# Patient Record
Sex: Male | Born: 1975 | Hispanic: Yes | Marital: Married | Smoking: Never smoker
Health system: Southern US, Community
[De-identification: ages and names within clinical notes are randomized; demographics above are authoritative.]

## PROBLEM LIST (undated history)

## (undated) DIAGNOSIS — M549 Dorsalgia, unspecified: Secondary | ICD-10-CM

## (undated) HISTORY — PX: APPENDECTOMY: SHX54

## (undated) HISTORY — DX: Dorsalgia, unspecified: M54.9

---

## 2020-03-22 ENCOUNTER — Encounter: Payer: Self-pay | Admitting: Emergency Medicine

## 2020-03-22 ENCOUNTER — Emergency Department
Admission: EM | Admit: 2020-03-22 | Discharge: 2020-03-22 | Disposition: A | Discharge status: Home / Self Care | Payer: 59 | Attending: Student in an Organized Health Care Education/Training Program | Admitting: Student in an Organized Health Care Education/Training Program

## 2020-03-22 ENCOUNTER — Emergency Department: Payer: 59

## 2020-03-22 ENCOUNTER — Other Ambulatory Visit: Payer: Self-pay

## 2020-03-22 DIAGNOSIS — S39012A Strain of muscle, fascia and tendon of lower back, initial encounter: Secondary | ICD-10-CM | POA: Diagnosis not present

## 2020-03-22 DIAGNOSIS — S3992XA Unspecified injury of lower back, initial encounter: Secondary | ICD-10-CM | POA: Diagnosis present

## 2020-03-22 DIAGNOSIS — Y93F2 Activity, caregiving, lifting: Secondary | ICD-10-CM | POA: Diagnosis not present

## 2020-03-22 DIAGNOSIS — X500XXA Overexertion from strenuous movement or load, initial encounter: Secondary | ICD-10-CM | POA: Insufficient documentation

## 2020-03-22 LAB — URINALYSIS, COMPLETE (UACMP) WITH MICROSCOPIC
Bacteria, UA: NONE SEEN
Bilirubin Urine: NEGATIVE
Glucose, UA: NEGATIVE mg/dL
Hgb urine dipstick: NEGATIVE
Ketones, ur: NEGATIVE mg/dL
Leukocytes,Ua: NEGATIVE
Nitrite: NEGATIVE
Protein, ur: NEGATIVE mg/dL
Specific Gravity, Urine: 1.024 (ref 1.005–1.030)
Squamous Epithelial / HPF: NONE SEEN (ref 0–5)
pH: 5 (ref 5.0–8.0)

## 2020-03-22 LAB — CBC
HCT: 41.8 % (ref 39.0–52.0)
Hemoglobin: 14.4 g/dL (ref 13.0–17.0)
MCH: 31.4 pg (ref 26.0–34.0)
MCHC: 34.4 g/dL (ref 30.0–36.0)
MCV: 91.1 fL (ref 80.0–100.0)
Platelets: 274 10*3/uL (ref 150–400)
RBC: 4.59 MIL/uL (ref 4.22–5.81)
RDW: 12.6 % (ref 11.5–15.5)
WBC: 6.9 10*3/uL (ref 4.0–10.5)
nRBC: 0 % (ref 0.0–0.2)

## 2020-03-22 LAB — COMPREHENSIVE METABOLIC PANEL
ALT: 18 U/L (ref 0–44)
AST: 20 U/L (ref 15–41)
Albumin: 4.1 g/dL (ref 3.5–5.0)
Alkaline Phosphatase: 63 U/L (ref 38–126)
Anion gap: 7 (ref 5–15)
BUN: 13 mg/dL (ref 6–20)
CO2: 25 mmol/L (ref 22–32)
Calcium: 8.9 mg/dL (ref 8.9–10.3)
Chloride: 105 mmol/L (ref 98–111)
Creatinine, Ser: 0.85 mg/dL (ref 0.61–1.24)
GFR, Estimated: >60 mL/min (ref >60)
Glucose, Bld: 100 mg/dL — ABNORMAL HIGH (ref 70–99)
Potassium: 3.7 mmol/L (ref 3.5–5.1)
Sodium: 137 mmol/L (ref 135–145)
Total Bilirubin: 0.6 mg/dL (ref 0.3–1.2)
Total Protein: 7.4 g/dL (ref 6.5–8.1)

## 2020-03-22 LAB — LIPASE, BLOOD: Lipase: 31 U/L (ref 11–51)

## 2020-03-22 MED ORDER — CYCLOBENZAPRINE HCL 10 MG PO TABS
10.0000 mg | ORAL_TABLET | Freq: Once | ORAL | Status: AC
  Administered 2020-03-22: 10 mg via ORAL
  Filled 2020-03-22: qty 1

## 2020-03-22 MED ORDER — ORPHENADRINE CITRATE ER 100 MG PO TB12: 100.0000 mg | ORAL_TABLET | Freq: Two times a day (BID) | ORAL | 1 refills | Status: DC

## 2020-03-22 MED ORDER — LIDOCAINE 5 % EX PTCH: 2.0000 | MEDICATED_PATCH | Freq: Two times a day (BID) | CUTANEOUS | 0 refills | Status: DC

## 2020-03-22 MED ORDER — MUPIROCIN CALCIUM 2 % EX CREA: TOPICAL_CREAM | CUTANEOUS | 0 refills | Status: DC

## 2020-03-22 MED ORDER — NAPROXEN 500 MG PO TABS: 500.0000 mg | ORAL_TABLET | Freq: Two times a day (BID) | ORAL | 2 refills | Status: DC

## 2020-03-22 MED ORDER — LIDOCAINE 5 % EX PTCH
2.0000 | MEDICATED_PATCH | CUTANEOUS | Status: DC
  Administered 2020-03-22: 2 via TRANSDERMAL
  Filled 2020-03-22: qty 2

## 2020-03-22 MED ORDER — NAPROXEN 500 MG PO TABS
500.0000 mg | ORAL_TABLET | Freq: Once | ORAL | Status: AC
  Administered 2020-03-22: 500 mg via ORAL
  Filled 2020-03-22: qty 1

## 2020-03-22 NOTE — Discharge Instructions (Signed)
Follow discharge care instructions take medications as directed.

## 2020-03-22 NOTE — ED Notes (Signed)
Pt reports generalized back pain that started in the lower back but now is also felt in the mid-lower back; no known trauma, keeps him awake at night. Pt also reports urinary frequency. Denies pain with urination

## 2020-03-22 NOTE — ED Triage Notes (Addendum)
Pt states generalized back pain for months. Pt states 9/10 pain. Pt states increased urinary frequency.   Pt NAD at this time, tolerated ambulation to triage well.

## 2020-03-22 NOTE — ED Provider Notes (Signed)
[facility] Emergency Department Provider Note   ____________________________________________   First MD Initiated Contact with Patient 03/22/20 1400     (approximate)  I have reviewed the triage vital signs and the nursing notes.   HISTORY  Chief Complaint Abdominal Pain    HPI Jermaine Hill is a 44 y.o. male patient presents with few months of back pain.  Patient denies bladder/ bowel dysfunction except for urinary frequency.  Patient denies polydipsia.  Patient denies specific provocative incident for complaint.  Patient his job requires intermitting heavy lifting.  Patient describes his pain is "achy/spasmatic".  Patient the pain radiates from the center of his back to the bilateral paraspinal muscle area.  Patient rates pain as a 9/10.  No palliative measure for complaint.          History reviewed. No pertinent past medical history.  There are no problems to display for this patient.   Past Surgical History:  Procedure Laterality Date  . APPENDECTOMY      Prior to Admission medications   Medication Sig Start Date End Date Taking? Authorizing Provider  lidocaine (LIDODERM) 5 % Place 2 patches onto the skin every 12 (twelve) hours. Remove & Discard patch within 12 hours or as directed by MD 03/22/20 03/22/21  Joni Reining, PA-C  mupirocin cream (BACTROBAN) 2 % Apply to affected affected toe 3 times daily. 03/22/20 03/22/21  Joni Reining, PA-C  naproxen (NAPROSYN) 500 MG tablet Take 1 tablet (500 mg total) by mouth 2 (two) times daily with a meal. 03/22/20 03/22/21  Joni Reining, PA-C  orphenadrine (NORFLEX) 100 MG tablet Take 1 tablet (100 mg total) by mouth 2 (two) times daily. 03/22/20 03/22/21  Joni Reining, PA-C    Allergies Patient has no allergy information on record.  No family history on file.  Social History Social History   Tobacco Use  . Smoking status: Never Smoker  . Smokeless tobacco: Never Used  Substance  Use Topics  . Alcohol use: Yes    Comment: socially  . Drug use: Never    Review of Systems Constitutional: No fever/chills Eyes: No visual changes. ENT: No sore throat. Cardiovascular: Denies chest pain. Respiratory: Denies shortness of breath. Gastrointestinal: No abdominal pain.  No nausea, no vomiting.  No diarrhea.  No constipation. Genitourinary: Negative for dysuria. Musculoskeletal: Positive for back pain. Skin: Negative for rash. Neurological: Negative for headaches, focal weakness or numbness.   ____________________________________________   PHYSICAL EXAM:  VITAL SIGNS: ED Triage Vitals  Enc Vitals Group     BP 03/22/20 1254 (!) 147/87     Pulse Rate 03/22/20 1254 83     Resp 03/22/20 1254 17     Temp 03/22/20 1254 98.4 F (36.9 C)     Temp Source 03/22/20 1254 Oral     SpO2 03/22/20 1254 96 %     Weight 03/22/20 1256 156 lb 8.4 oz (71 kg)     Height 03/22/20 1256 5' 10.08" (1.78 m)     Head Circumference --      Peak Flow --      Pain Score 03/22/20 1256 9     Pain Loc --      Pain Edu? --      Excl. in GC? --    Constitutional: Alert and oriented. Well appearing and in no acute distress. Cardiovascular: Normal rate, regular rhythm. Grossly normal heart sounds.  Good peripheral circulation. Respiratory: Normal respiratory effort.  No retractions. Lungs CTAB.  Gastrointestinal: Soft and nontender. No distention. No abdominal bruits. No CVA tenderness. Genitourinary: Deferred Musculoskeletal: No obvious lumbar spine deformity.  Patient has bilateral paraspinal muscle guarding and spasms.  No lower extremity tenderness nor edema.  No joint effusions. Neurologic:  Normal speech and language. No gross focal neurologic deficits are appreciated. No gait instability. Skin: Edema and erythema right great toe.  Psychiatric: Mood and affect are normal. Speech and behavior are normal.  ____________________________________________   LABS (all labs ordered are  listed, but only abnormal results are displayed)  Labs Reviewed  COMPREHENSIVE METABOLIC PANEL - Abnormal; Notable for the following components:      Result Value   Glucose, Bld 100 (*)    All other components within normal limits  URINALYSIS, COMPLETE (UACMP) WITH MICROSCOPIC - Abnormal; Notable for the following components:   Color, Urine YELLOW (*)    APPearance CLEAR (*)    All other components within normal limits  LIPASE, BLOOD  CBC   ____________________________________________  EKG   ____________________________________________  RADIOLOGY I, Joni Reining, personally viewed and evaluated these images (plain radiographs) as part of my medical decision making, as well as reviewing the written report by the radiologist.  ED MD interpretation:    Official radiology report(s): DG Lumbar Spine 2-3 Views  Result Date: 03/22/2020 CLINICAL DATA:  Low back pain EXAM: LUMBAR SPINE - 2-3 VIEW COMPARISON:  None. FINDINGS: Frontal, lateral, and spot lumbosacral lateral images were obtained. There are 5 non-rib-bearing lumbar type vertebral bodies. There is no fracture or spondylolisthesis. The disc spaces appear normal. No appreciable erosive change. IMPRESSION: No fracture or spondylolisthesis.  No evident arthropathy Electronically Signed   By: Bretta Bang III M.D.   On: 03/22/2020 14:55    ____________________________________________   PROCEDURES  Procedure(s) performed (including Critical Care):  Procedures   ____________________________________________   INITIAL IMPRESSION / ASSESSMENT AND PLAN / ED COURSE  As part of my medical decision making, I reviewed the following data within the electronic MEDICAL RECORD NUMBER     Patient presents with bilateral back pain for few months.  Patient also stated with increased urinary frequency and he was concerned for diabetes.  Patient also has edema and erythema to the right great toe.  Patient complaining physical exam  consistent with lumbar strain.  Patient also has cellulitis to the right great toe.  Patient given discharge care instruction and advised to take medication as directed.  [facility]          ____________________________________________   FINAL CLINICAL IMPRESSION(S) / ED DIAGNOSES  Final diagnoses:  Lumbar strain, initial encounter     ED Discharge Orders         Ordered    lidocaine (LIDODERM) 5 %  Every 12 hours        03/22/20 1511    naproxen (NAPROSYN) 500 MG tablet  2 times daily with meals        03/22/20 1511    orphenadrine (NORFLEX) 100 MG tablet  2 times daily        03/22/20 1511    mupirocin cream (BACTROBAN) 2 %        03/22/20 1513          *Please note:  Nassim Cosma was evaluated in Emergency Department on 03/22/2020 for the symptoms described in the history of present illness. He was evaluated in the context of the global COVID-19 pandemic, which necessitated consideration that the patient might be at  risk for infection with the SARS-CoV-2 virus that causes COVID-19. Institutional protocols and algorithms that pertain to the evaluation of patients at risk for COVID-19 are in a state of rapid change based on information released by regulatory bodies including the CDC and federal and state organizations. These policies and algorithms were followed during the patient's care in the ED.  Some ED evaluations and interventions may be delayed as a result of limited staffing during and the pandemic.*   Note:  This document was prepared using Dragon voice recognition software and may include unintentional dictation errors.    Joni Reining, PA-C 03/22/20 1516    Willy Eddy, MD 03/22/20 1534

## 2020-09-15 ENCOUNTER — Telehealth: Payer: Self-pay

## 2020-09-15 NOTE — Telephone Encounter (Signed)
This patient called to see if him, his wife, and daughter can establish care with Dr. [place].  Wife is [name] MRN [id number] Daughter is [name] MRN [id number]  He would like to have them all seen together at the same time after 3:30   Can I schedule these patients?

## 2020-09-16 NOTE — Telephone Encounter (Signed)
Okay to schedule

## 2020-09-29 ENCOUNTER — Ambulatory Visit (INDEPENDENT_AMBULATORY_CARE_PROVIDER_SITE_OTHER): Payer: 59 | Admitting: Family Medicine

## 2020-09-29 ENCOUNTER — Encounter: Payer: Self-pay | Admitting: Family Medicine

## 2020-09-29 ENCOUNTER — Other Ambulatory Visit: Payer: Self-pay

## 2020-09-29 VITALS — BP 120/80 | HR 85 | Temp 98.4°F | Resp 12 | Ht 70.8 in | Wt 159.6 lb

## 2020-09-29 DIAGNOSIS — R350 Frequency of micturition: Secondary | ICD-10-CM

## 2020-09-29 DIAGNOSIS — M545 Low back pain, unspecified: Secondary | ICD-10-CM

## 2020-09-29 DIAGNOSIS — G8929 Other chronic pain: Secondary | ICD-10-CM | POA: Diagnosis not present

## 2020-09-29 NOTE — Patient Instructions (Addendum)
A few things to remember from today's visit:   Chronic right-sided low back pain without sciatica - Plan: Basic metabolic panel, CBC, Urinalysis, Routine w reflex microscopic, Ambulatory referral to Physical Therapy  If you need refills please call your pharmacy. Do not use My Chart to request refills or for acute issues that need immediate attention.    Dolor de espalda crnico Chronic Back Pain Cuando el dolor en la espalda dura ms de 3 meses, se denomina dolor de espalda crnico. El dolor puede empeorar en ciertos momentos (exacerbaciones). Hay cosas que puede hacer en su casa para Runner, broadcasting/film/video. Siga estas instrucciones en su casa: Est atento a cualquier cambio en los sntomas. Tome estas medidas para Acupuncturist dolor: Control del dolor y de la rigidez  Si se lo indican, aplique hielo sobre la zona dolorida. El mdico puede indicarle que use hielo durante las 24 a 48 horas luego del comienzo de Investment banker, corporate. Para hacer esto: ? Ponga el hielo en una bolsa plstica. ? Coloque una [employer] piel y Copy. ? Coloque el hielo durante , 2 a 3veces por da.  Si se lo indican, aplique calor sobre la zona dolorida. Hgalo con la frecuencia que le haya indicado el mdico. Use la fuente de calor que el mdico le recomiende, como una compresa de calor hmedo o una almohadilla trmica. ? Coloque una [employer] piel y la fuente de Airline pilot. ? Aplique calor durante 20 a . ? Retire la fuente de calor si la piel se le pone de color rojo brillante. Esto es especialmente importante si no puede sentir dolor, calor o fro. Puede correr un riesgo mayor de sufrir quemaduras.  Sumrjase en un bao clido. Esto puede ayudar a Engineer, materials.      Actividad  Evite agacharse y Education officer, environmental otras actividades que Forensic scientist.  Cuando est de pie: ? Mantenga la parte alta de la espalda y el cuello rectos. ? Mantenga los hombros [employer]. ? Evite  encorvarse.  Cuando est sentado: ? Mantenga la espalda recta. ? Relaje los hombros. No curve los hombros ni los [employer].  No permanezca sentado o de pie en el mismo lugar durante mucho tiempo.  Durante el da, haga pausas breves para descansar. Por lo general, recostarse o Personal assistant de pie es mejor que permanecer sentado. Descansar puede ayudar a Engineer, materials.  Cuando est sentado o de pie por [employer], haga un poco de [place] o ejercicios de estiramiento. Esto ayudar a Transport planner rigidez y Chief Technology Officer.  Haga ejercicio con regularidad. Pregntele al mdico qu actividades son seguras para usted.  No levante ningn objeto que pese ms de 10libras (4.5kg) o el lmite de peso que le indiquen hasta que el mdico le diga que puede [place].  Para evitar lesionarse cuando levanta objetos: ? Flexione las rodillas. ? Mantenga el peso cerca del cuerpo. ? No se tuerza.  Duerma sobre un [employer]. Intente acostarse de costado, con las rodillas ligeramente flexionadas. Si se recuesta [employer], coloque una almohada debajo de las rodillas.   Medicamentos  El tratamiento puede incluir medicamentos para Chief Technology Officer y la hinchazn que se toman por boca o que se ponen sobre la piel, analgsicos recetados o relajantes musculares.  Use los medicamentos de venta libre y los recetados solamente como se lo haya indicado el mdico.  Consulte a su mdico si el medicamento que le recetaron: ? Hace necesario que evite  conducir o Child psychotherapist. ? Puede causarle dificultad para defecar (estreimiento). Es posible que deba tomar medidas para prevenir o tratar los problemas para defecar:  Product manager suficiente lquido para Radio producer pis (la orina) de color amarillo plido.  Usar medicamentos recetados o de Sales promotion account executive.  Comer alimentos ricos en fibra. Entre ellos, frijoles, cereales integrales y frutas y verduras frescas.  Limitar los alimentos con alto contenido de grasa y  International aid/development worker. Estos incluyen alimentos fritos o dulces. Instrucciones generales  No consuma ningn producto que contenga nicotina o tabaco, como cigarrillos, cigarrillos electrnicos y tabaco de Theatre manager. Si necesita ayuda para dejar de consumir estos productos, consulte al mdico.  Concurra a todas las visitas de seguimiento como se lo haya indicado el mdico. Esto es importante. Comunquese con un mdico si:  El dolor no se alivia con reposo ni con medicamentos.  Su dolor empeora o tiene un dolor nuevo.  Tiene fiebre alta.  Pierde peso muy rpidamente.  Tiene dificultad para [employer] cotidianas. Solicite ayuda de inmediato si:  Siente debilidad en una o ambas piernas o pies.  Pierde la sensibilidad (adormecimiento) en una o ambas piernas o pies.  Tiene dificultad para Scientist, physiological materia fecal (las deposiciones) o el pis (la [place]).  Tiene un dolor muy intenso en la espalda y: ? Tiene ganas de vomitar (nuseas) o vomita. ? Siente dolor en el vientre (abdomen). ? Le falta el aire. ? Se desmaya. Resumen  Cuando el dolor en la espalda dura ms de 3 meses, se denomina dolor de espalda crnico.  El dolor puede empeorar en ciertos momentos (exacerbaciones).  Use hielo y calor segn le indique el mdico. El mdico puede indicarle que use hielo luego del comienzo de las exacerbaciones. Esta informacin no tiene Theme park manager el consejo del mdico. Asegrese de hacerle al mdico cualquier pregunta que tenga. Document Revised: 09/10/2019 Document Reviewed: 09/10/2019 Elsevier Patient Education  2021 Elsevier Inc.  Please be sure medication list is accurate. If a new problem present, please set up appointment sooner than planned today.

## 2020-09-29 NOTE — Progress Notes (Signed)
HPI: Mr.Jermaine Hill is a 45 y.o. male, who is here today to establish care.  Former PCP: N/A Last preventive routine visit: A few years ago.  Chronic medical problems: Chronic back pain.  Concerns today: Back pain. Right-sided lower back pain, intermittent for years. Pain is exacerbated by movement and usually worse at the end of the day after work.  Alleviated by rest and position changes. Pain interferes with sleep sometimes. TENS has helped. Pain is not radiated. Negative for saddle anesthesia,LE numbness/tingling,or bladder/bowel dysfunction.  He is concerned about possible renal disease causing symptoms. Urinary frequency sine 03/2020. Sometimes urine has "strong" odor. Negative for dysuria, gross hematuria,or decreased urine output. He has not noted fever,abnormal wt loss,night sweats,abdominal pain,N/V,or urethral discharge.  He was evaluated in the ER on 03/22/20 for back pain. He was treated with Flexeril and Naproxen.  No hx of trauma. Topical OTC treatment helped some.  Review of Systems  Constitutional: Negative for activity change, appetite change and fatigue.  HENT: Negative for nosebleeds and sore throat.   Respiratory: Negative for cough, shortness of breath and wheezing.   Cardiovascular: Negative for chest pain, palpitations and leg swelling.  Gastrointestinal: Negative for blood in stool.       No changes in bowel habits.  Skin: Negative for pallor and rash.  Neurological: Negative for syncope, weakness and headaches.  Psychiatric/Behavioral: Negative for confusion. The patient is nervous/anxious.   Rest see pertinent positives and negatives per HPI.  No current outpatient medications on file prior to visit.   No current facility-administered medications on file prior to visit.   Past Medical History:  Diagnosis Date  . Back pain    No Known Allergies  Family History  Problem Relation Age of Onset  . Hypertension Mother   . Diabetes  Sister   . Alcohol abuse Brother   . Arthritis Paternal Grandmother   . Asthma Paternal Grandfather   . Early death Paternal Grandfather     Social History   Socioeconomic History  . Marital status: Married    Spouse name: Not on file  . Number of children: Not on file  . Years of education: Not on file  . Highest education level: Not on file  Occupational History  . Not on file  Tobacco Use  . Smoking status: Never Smoker  . Smokeless tobacco: Never Used  Substance and Sexual Activity  . Alcohol use: Yes    Comment: socially  . Drug use: Never  . Sexual activity: Yes  Other Topics Concern  . Not on file  Social History Narrative  . Not on file   Social Determinants of Health   Financial Resource Strain: Not on file  Food Insecurity: Not on file  Transportation Needs: Not on file  Physical Activity: Not on file  Stress: Not on file  Social Connections: Not on file    Vitals:   09/29/20 1547  BP: 120/80  Pulse: 85  Resp: 12  Temp: 98.4 F (36.9 C)  SpO2: 96%   Body mass index is 22.39 kg/m.  Physical Exam Vitals and nursing note reviewed.  Constitutional:      General: He is not in acute distress.    Appearance: He is well-developed and normal weight.  HENT:     Head: Normocephalic and atraumatic.     Mouth/Throat:     Mouth: Mucous membranes are moist.     Pharynx: Oropharynx is clear.  Eyes:     Conjunctiva/sclera: Conjunctivae normal.  Cardiovascular:     Rate and Rhythm: Normal rate and regular rhythm.     Pulses:          Dorsalis pedis pulses are 2+ on the right side and 2+ on the left side.     Heart sounds: No murmur heard.   Pulmonary:     Effort: Pulmonary effort is normal. No respiratory distress.     Breath sounds: Normal breath sounds.  Abdominal:     Palpations: Abdomen is soft. There is no hepatomegaly or mass.     Tenderness: There is no abdominal tenderness. There is no right CVA tenderness or left CVA tenderness.   Musculoskeletal:     Lumbar back: Tenderness present. No bony tenderness. Negative right straight leg raise test and negative left straight leg raise test.  Lymphadenopathy:     Cervical: No cervical adenopathy.  Skin:    General: Skin is warm.     Findings: No erythema or rash.  Neurological:     Mental Status: He is alert and oriented to person, place, and time.     Cranial Nerves: No cranial nerve deficit.     Gait: Gait normal.     Deep Tendon Reflexes:     Reflex Scores:      Patellar reflexes are 2+ on the right side and 2+ on the left side. Psychiatric:        Mood and Affect: Mood is anxious.     Comments: Well groomed, good eye contact.    ASSESSMENT AND PLAN:  Jermaine Hill was seen today for establish care.  Diagnoses and all orders for this visit:  Orders Placed This Encounter  Procedures  . Basic metabolic panel  . CBC  . Urinalysis, Routine w reflex microscopic  . Ambulatory referral to Physical Therapy   Lab Results  Component Value Date   CREATININE 0.96 09/29/2020   BUN 18 09/29/2020   NA 138 09/29/2020   K 3.9 09/29/2020   CL 105 09/29/2020   CO2 27 09/29/2020   Lab Results  Component Value Date   WBC 6.6 09/29/2020   HGB 14.0 09/29/2020   HCT 41.1 09/29/2020   MCV 92.8 09/29/2020   PLT 291.0 09/29/2020   Chronic right-sided low back pain without sciatica Explained that this is a chronic problem, so the probability of problem resolution is low. Goal of treatment discussed. We discussed side effects of chronic use of NSAID's, given his concerns about renal disease, recommend Tylenol 500 mg 3-4 times per day. Continue OTC topical treatment. PT will be arranged. If not improved, we can consider ortho evaluation.  Urinary frequency No associated dysuria or gross hematuria. Hx does not suggest a serious process. Adequate hydration recommended. Further recommendations according to UA and BMP. Instructed about warning signs.   Return in 1 year  (on 09/29/2021), or if symptoms worsen or fail to improve, for Before if needed..   Remer Couse G. SwazilandJordan, MD  [facility]. [facility].  A few things to remember from today's visit:   Chronic right-sided low back pain without sciatica - Plan: Basic metabolic panel, CBC, Urinalysis, Routine w reflex microscopic, Ambulatory referral to Physical Therapy  If you need refills please call your pharmacy. Do not use My Chart to request refills or for acute issues that need immediate attention.    Dolor de espalda crnico Chronic Back Pain Cuando el dolor en la espalda dura ms de 3 meses, se denomina dolor de espalda crnico. El dolor puede empeorar en  ciertos momentos (exacerbaciones). Hay cosas que puede hacer en su casa para Runner, broadcasting/film/video. Siga estas instrucciones en su casa: Est atento a cualquier cambio en los sntomas. Tome estas medidas para Acupuncturist dolor: Control del dolor y de la rigidez  Si se lo indican, aplique hielo sobre la zona dolorida. El mdico puede indicarle que use hielo durante las 24 a 48 horas luego del comienzo de Investment banker, corporate. Para hacer esto: ? Ponga el hielo en una bolsa plstica. ? Coloque una [employer] piel y Copy. ? Coloque el hielo durante , 2 a 3veces por da.  Si se lo indican, aplique calor sobre la zona dolorida. Hgalo con la frecuencia que le haya indicado el mdico. Use la fuente de calor que el mdico le recomiende, como una compresa de calor hmedo o una almohadilla trmica. ? Coloque una [employer] piel y la fuente de Airline pilot. ? Aplique calor durante 20 a . ? Retire la fuente de calor si la piel se le pone de color rojo brillante. Esto es especialmente importante si no puede sentir dolor, calor o fro. Puede correr un riesgo mayor de sufrir quemaduras.  Sumrjase en un bao clido. Esto puede ayudar a Engineer, materials.      Actividad  Evite agacharse y Education officer, environmental otras actividades que Journalist, newspaper.  Cuando est de pie: ? Mantenga la parte alta de la espalda y el cuello rectos. ? Mantenga los hombros [employer]. ? Evite encorvarse.  Cuando est sentado: ? Mantenga la espalda recta. ? Relaje los hombros. No curve los hombros ni los D[employer] Restaurants.  No permanezca sentado o de pie en el mismo lugar durante mucho tiempo.  Durante el da, haga pausas breves para descansar. Por lo general, recostarse o Personal assistant de pie es mejor que permanecer sentado. Descansar puede ayudar a Engineer, materials.  Cuando est sentado o de pie por [employer], haga un poco de [place] o ejercicios de estiramiento. Esto ayudar a Transport planner rigidez y Chief Technology Officer.  Haga ejercicio con regularidad. Pregntele al mdico qu actividades son seguras para usted.  No levante ningn objeto que pese ms de 10libras (4.5kg) o el lmite de peso que le indiquen hasta que el mdico le diga que puede [place].  Para evitar lesionarse cuando levanta objetos: ? Flexione las rodillas. ? Mantenga el peso cerca del cuerpo. ? No se tuerza.  Duerma sobre un [employer]. Intente acostarse de costado, con las rodillas ligeramente flexionadas. Si se recuesta [employer], coloque una almohada debajo de las rodillas.   Medicamentos  El tratamiento puede incluir medicamentos para Chief Technology Officer y la hinchazn que se toman por boca o que se ponen sobre la piel, analgsicos recetados o relajantes musculares.  Use los medicamentos de venta libre y los recetados solamente como se lo haya indicado el mdico.  Consulte a su mdico si el medicamento que le recetaron: ? Hace necesario que evite conducir o usar [place]. ? Puede causarle dificultad para defecar (estreimiento). Es posible que deba tomar medidas para prevenir o tratar los problemas para defecar:  Product manager suficiente lquido para Radio producer pis (la orina) de color amarillo plido.  Usar medicamentos recetados o de Sales promotion account executive.  Comer alimentos  ricos en fibra. Entre ellos, frijoles, cereales integrales y frutas y verduras frescas.  Limitar los alimentos con alto contenido de grasa y International aid/development worker. Estos incluyen alimentos fritos o dulces. Instrucciones generales  No consuma ningn producto que contenga  nicotina o tabaco, como cigarrillos, cigarrillos electrnicos y tabaco de Theatre manager. Si necesita ayuda para dejar de consumir estos productos, consulte al mdico.  Concurra a todas las visitas de seguimiento como se lo haya indicado el mdico. Esto es importante. Comunquese con un mdico si:  El dolor no se alivia con reposo ni con medicamentos.  Su dolor empeora o tiene un dolor nuevo.  Tiene fiebre alta.  Pierde peso muy rpidamente.  Tiene dificultad para [employer] cotidianas. Solicite ayuda de inmediato si:  Siente debilidad en una o ambas piernas o pies.  Pierde la sensibilidad (adormecimiento) en una o ambas piernas o pies.  Tiene dificultad para Scientist, physiological materia fecal (las deposiciones) o el pis (la [place]).  Tiene un dolor muy intenso en la espalda y: ? Tiene ganas de vomitar (nuseas) o vomita. ? Siente dolor en el vientre (abdomen). ? Le falta el aire. ? Se desmaya. Resumen  Cuando el dolor en la espalda dura ms de 3 meses, se denomina dolor de espalda crnico.  El dolor puede empeorar en ciertos momentos (exacerbaciones).  Use hielo y calor segn le indique el mdico. El mdico puede indicarle que use hielo luego del comienzo de las exacerbaciones. Esta informacin no tiene Theme park manager el consejo del mdico. Asegrese de hacerle al mdico cualquier pregunta que tenga. Document Revised: 09/10/2019 Document Reviewed: 09/10/2019 Elsevier Patient Education  2021 Elsevier Inc.  Please be sure medication list is accurate. If a new problem present, please set up appointment sooner than planned today.

## 2020-09-30 LAB — URINALYSIS, ROUTINE W REFLEX MICROSCOPIC
Bilirubin Urine: NEGATIVE
Hgb urine dipstick: NEGATIVE
Ketones, ur: NEGATIVE
Leukocytes,Ua: NEGATIVE
Nitrite: NEGATIVE
RBC / HPF: NONE SEEN (ref 0–?)
Specific Gravity, Urine: 1.02 (ref 1.000–1.030)
Total Protein, Urine: NEGATIVE
Urine Glucose: NEGATIVE
Urobilinogen, UA: 0.2 (ref 0.0–1.0)
WBC, UA: NONE SEEN (ref 0–?)
pH: 6.5 (ref 5.0–8.0)

## 2020-09-30 LAB — BASIC METABOLIC PANEL
BUN: 18 mg/dL (ref 6–23)
CO2: 27 mEq/L (ref 19–32)
Calcium: 9.2 mg/dL (ref 8.4–10.5)
Chloride: 105 mEq/L (ref 96–112)
Creatinine, Ser: 0.96 mg/dL (ref 0.40–1.50)
GFR: 96.06 mL/min (ref >60.00)
Glucose, Bld: 105 mg/dL — ABNORMAL HIGH (ref 70–99)
Potassium: 3.9 mEq/L (ref 3.5–5.1)
Sodium: 138 mEq/L (ref 135–145)

## 2020-09-30 LAB — CBC
HCT: 41.1 % (ref 39.0–52.0)
Hemoglobin: 14 g/dL (ref 13.0–17.0)
MCHC: 34 g/dL (ref 30.0–36.0)
MCV: 92.8 fl (ref 78.0–100.0)
Platelets: 291 10*3/uL (ref 150.0–400.0)
RBC: 4.42 Mil/uL (ref 4.22–5.81)
RDW: 13.3 % (ref 11.5–15.5)
WBC: 6.6 10*3/uL (ref 4.0–10.5)

## 2020-10-02 ENCOUNTER — Encounter: Payer: Self-pay | Admitting: Family Medicine

## 2020-10-10 ENCOUNTER — Ambulatory Visit: Payer: 59 | Attending: Family Medicine | Admitting: Physical Therapy

## 2020-10-10 ENCOUNTER — Other Ambulatory Visit: Payer: Self-pay

## 2020-10-10 ENCOUNTER — Encounter: Payer: Self-pay | Admitting: Physical Therapy

## 2020-10-10 DIAGNOSIS — R252 Cramp and spasm: Secondary | ICD-10-CM | POA: Diagnosis present

## 2020-10-10 DIAGNOSIS — G8929 Other chronic pain: Secondary | ICD-10-CM | POA: Insufficient documentation

## 2020-10-10 DIAGNOSIS — M545 Low back pain, unspecified: Secondary | ICD-10-CM | POA: Diagnosis not present

## 2020-10-10 NOTE — Patient Instructions (Signed)
Access Code: [email or web address] URL: [email or web address] Date: 10/10/2020 Prepared by: Loistine Simas Jaheem Hedgepath  Exercises Supine Hamstring Stretch - 1 x daily - 7 x weekly - 1 sets - 3 reps - 30 hold Supine Figure 4 Piriformis Stretch - 1 x daily - 7 x weekly - 1 sets - 3 reps - 30 hold Supine Piriformis Stretch Pulling Heel to Hip - 1 x daily - 7 x weekly - 1 sets - 3 reps - 30 hold Sidelying Thoracic Rotation with Open Book - 1 x daily - 7 x weekly - 1 sets - 10 reps Standing Hip Flexor Stretch - 1 x daily - 7 x weekly - 1 sets - 3 reps - 30 hold Supine Pelvic Floor Stretch - Hands on Knees - 1 x daily - 7 x weekly - 1 sets - 2 reps - 60 hold Child's Pose Stretch - 1 x daily - 7 x weekly - 1 sets - 3 reps - 10 hold Child's Pose with Sidebending - 1 x daily - 7 x weekly - 1 sets - 3 reps - 10 hold   Trigger Point Dry Needling  . What is Trigger Point Dry Needling (DN)? o DN is a physical therapy technique used to treat muscle pain and dysfunction. Specifically, DN helps deactivate muscle trigger points (muscle knots).  o A thin filiform needle is used to penetrate the skin and stimulate the underlying trigger point. The goal is for a local twitch response (LTR) to occur and for the trigger point to relax. No medication of any kind is injected during the procedure.   . What Does Trigger Point Dry Needling Feel Like?  o The procedure feels different for each individual patient. Some patients report that they do not actually feel the needle enter the skin and overall the process is not painful. Very mild bleeding may occur. However, many patients feel a deep cramping in the muscle in which the needle was inserted. This is the local twitch response.   Marland Kitchen How Will I feel after the treatment? o Soreness is normal, and the onset of soreness may not occur for a few hours. Typically this soreness does not last longer than two days.  o Bruising is uncommon, however; ice can be used to decrease any  possible bruising.  o In rare cases feeling tired or nauseous after the treatment is normal. In addition, your symptoms may get worse before they get better, this period will typically not last longer than 24 hours.   . What Can I do After My Treatment? o Increase your hydration by drinking more water for the next 24 hours. o You may place ice or heat on the areas treated that have become sore, however, do not use heat on inflamed or bruised areas. Heat often brings more relief post needling. o You can continue your regular activities, but vigorous activity is not recommended initially after the treatment for 24 hours. o DN is best combined with other physical therapy such as strengthening, stretching, and other therapies.    [facility] [address] [place], [place] [address] Phone # [phone] Fax [phone]

## 2020-10-10 NOTE — Therapy (Signed)
[facility] [address] [place], [place], [address] Phone: [phone]   Fax:  [phone]  Physical Therapy Evaluation  Patient Details  Name: Jermaine Hill MRN: [id number] Date of Birth: 1975-07-14 Referring Provider (PT): Betty Swaziland, MD   Encounter Date: 10/10/2020   PT End of Session - 10/10/20 0853    Visit Number 1    Date for PT Re-Evaluation 12/05/20    Authorization Type [facility]    PT Start Time 0855   Pt late   PT Stop Time 0945    PT Time Calculation (min) 50 min    Activity Tolerance Patient tolerated treatment well    Behavior During Therapy [facility] for tasks assessed/performed           Past Medical History:  Diagnosis Date  . Back pain     Past Surgical History:  Procedure Laterality Date  . APPENDECTOMY      There were no vitals filed for this visit.    Subjective Assessment - 10/10/20 0852    Subjective Pt referred to OPPT with history of low back pain.  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  Currently working as a Engineer, civil (consulting).  Pain is right sided and pain is worse with work tasks such as bending and pushing pedal of vehicle.  Pain disrupts sleep after 3-4 hours.    Limitations Other (comment)   work tasks   Diagnostic tests lumbar xrays 2021 negative    Currently in Pain? Yes    Pain Score 7     Pain Location Back    Pain Orientation Right    Pain Descriptors / Indicators Sharp    Pain Type Chronic pain;Acute pain    Pain Relieving Factors uses topical cream with analgesic              [facility] PT Assessment - 10/10/20 0001      Assessment   Medical Diagnosis M54.50,G89.29 (ICD-10-CM) - Chronic right-sided low back pain without sciatica    Referring Provider (PT) Betty Swaziland, MD    Onset Date/Surgical Date --   1 year ago   Hand Dominance Right     Prior Therapy no      Home Environment   Living Environment Private residence    Living Arrangements Spouse/significant other;Children      Prior Function   Level of Independence Independent    Vocation Full time employment   3rd shift     Functional Tests   Functional tests Squat      Squat   Comments ind with mild LBP      Posture/Postural Control   Posture/Postural Control No significant limitations      ROM / Strength   AROM / PROM / Strength AROM;PROM;Strength      AROM   Overall AROM  Within functional limits for tasks performed    AROM Assessment Site Lumbar;Thoracic    Lumbar Flexion full with pain on flexion and return from flexion    Lumbar Extension full, relief    Thoracic - Right Rotation limited 30%    Thoracic - Left Rotation limited 30%      PROM   Overall PROM Comments bil hips limited flexion and ER, soft tissue stretch end feel      Strength   Overall Strength Comments 5/5 bil LEs  Flexibility   Soft Tissue Assessment /Muscle Length yes   hip flexors and gluts limited 25% bil   Hamstrings limited 25% bil    Quadriceps limited 25% bil    Piriformis limited 30% bil    Quadratus Lumborum limited 20% bil      Palpation   Spinal mobility limited lumbar PAs L4-S1, relief with testing    SI assessment  Rt SI joint mildly compressed    Palpation comment signif soft tissue tension and spasm with pain on palpation: bil QL, lumbar paraspinals, thoracic paraspinals      Special Tests    Special Tests Lumbar    Lumbar Tests Straight Leg Raise      Straight Leg Raise   Findings Negative      Transfers   Comments ind with all transfers      Ambulation/Gait   Gait Comments WNL                      Objective measurements completed on examination: See above findings.       [facility] Adult PT Treatment/Exercise - 10/10/20 0001      Self-Care   Self-Care Other Self-Care Comments    Other Self-Care Comments  DN info and initial HEP,  discussion of possibility of pelvic floor tension on urinary frequency/urgency when pain is present                  PT Education - 10/10/20 0946    Education Details Access Code: [email or web address] + DN info    Person(s) Educated Patient    Methods Explanation;Handout;Demonstration;Verbal cues    Comprehension Verbalized understanding;Returned demonstration            PT Short Term Goals - 10/10/20 1005      PT SHORT TERM GOAL #1   Title Pt will be ind with initial HEP    Status New    Target Date 10/31/20      PT SHORT TERM GOAL #2   Title Pt will report improved sleep with at least 5 hours consecutive sleep before sleep disruption    Time 4    Period Weeks    Status New    Target Date 11/07/20             PT Long Term Goals - 10/10/20 1006      PT LONG TERM GOAL #1   Title Pt will achieve LE and trunk flexibility of soft tissues to WNL to reduce pain and undue compression on spine and pelvis.    Time 8    Period Weeks    Status New    Target Date 12/05/20      PT LONG TERM GOAL #2   Title Pt will be able to tolerate a work shift with min pain and know how to use tools from HEP to manage pain with work.    Time 8    Period Weeks    Status New    Target Date 12/05/20      PT LONG TERM GOAL #3   Title Pt will report at least 70% improvement in sleep quality due to improved pain.    Time 8    Period Weeks    Status New    Target Date 12/05/20      PT LONG TERM GOAL #4   Title Pt will demo improved thoracic rotation bil to WNL for improved visibility with driving.    Baseline limited 30% bil  Time 8    Period Weeks    Status New    Target Date 12/05/20      PT LONG TERM GOAL #5   Title Ind with advanced HEP and understand how to progress safely.    Time 8    Period Weeks    Status New    Target Date 12/05/20                  Plan - 10/10/20 0956    Clinical Impression Statement Pt is a pleasant 45yo male who moved to [place] from  [place] approx 1 year ago.  He has been working a variety of heavy labor jobs which have caused him to experience Rt>Lt low back pain.  He occassionally gets pain in thoracic spine at night after sleeping 3-4 hours.  He also notes increased urinary freq/urgency when his pain increases and wonders if it is related.  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  He works third shift.  Pt presents with full lumbar ROM with pain on flexion and return from flexion.  He finds relief in extension.  Bil thoracic rotation is limited 30%.  He has tightness and spasm present Rt>Lt in QL, lumbar paraspinals and thoracic paraspinals with pain on palpation.  LE flexiblity and hip ROM is mod-sig limited.  PT initiated HEP for spinal and LE stretching after which Pt reported improved pain.  PT discussed limits of scope of orthopedic PT with regards to his urinary freq/urgency but PT did explain how he may have pelvic floor tension and gave happy baby and diaphragmatic breathing cues with focus of pelvic muscle relaxation to see if this helps him.  Pt will benefit from skilled PT for manual techniques, stretching, ROM and HEP development to reduce pain and maximize function.    Personal Factors and Comorbidities Time since onset of injury/illness/exacerbation;Profession    Examination-Activity Limitations Lift;Sit;Bend;Sleep    Examination-Participation Restrictions Driving;Occupation;Cleaning;Yard Work    Stability/Clinical Decision Making Stable/Uncomplicated    Optometrist Low    Rehab Potential Excellent    PT Frequency 1x / week    PT Duration 8 weeks    PT Treatment/Interventions ADLs/Self Care Home Management;Electrical Stimulation;Cryotherapy;Moist Heat;Therapeutic activities;Therapeutic exercise;Neuromuscular re-education;Manual techniques;Patient/family education;Passive range of motion;Dry needling;Spinal Manipulations;Joint  Manipulations    PT Next Visit Plan f/u on response to initial HEP, manual or DN to bil QL, lumbar multifidi, manual techniques for lumbar gapping and STM along spine and hips for release    PT Home Exercise Plan Access Code: [email or web address]    Consulted and Agree with Plan of Care Patient           Patient will benefit from skilled therapeutic intervention in order to improve the following deficits and impairments:  Decreased range of motion,Pain,Increased muscle spasms,Decreased mobility,Hypomobility,Impaired flexibility,Decreased activity tolerance  Visit Diagnosis: Chronic bilateral low back pain without sciatica - Plan: PT plan of care cert/re-cert  Cramp and spasm - Plan: PT plan of care cert/re-cert     Problem List There are no problems to display for this patient.  Loistine Simas Patsy Varma, PT 10/10/20 10:12 AM   [facility] [address] [place], [place], [address] Phone: [phone]   Fax:  [phone]  Name: Vergil Burby MRN: [id number] Date of Birth: 11-23-1975

## 2020-10-15 ENCOUNTER — Ambulatory Visit: Payer: 59 | Admitting: Physical Therapy

## 2020-10-29 ENCOUNTER — Encounter: Payer: 59 | Admitting: Physical Therapy

## 2020-11-05 ENCOUNTER — Encounter: Payer: 59 | Admitting: Physical Therapy

## 2020-11-12 ENCOUNTER — Ambulatory Visit: Payer: 59 | Admitting: Family Medicine

## 2020-11-18 ENCOUNTER — Other Ambulatory Visit: Payer: Self-pay

## 2020-11-19 ENCOUNTER — Ambulatory Visit (INDEPENDENT_AMBULATORY_CARE_PROVIDER_SITE_OTHER): Payer: 59 | Admitting: Family Medicine

## 2020-11-19 VITALS — BP 120/80 | HR 73 | Temp 98.0°F | Resp 18 | Ht 70.0 in | Wt 155.4 lb

## 2020-11-19 DIAGNOSIS — R350 Frequency of micturition: Secondary | ICD-10-CM | POA: Diagnosis not present

## 2020-11-19 DIAGNOSIS — M545 Low back pain, unspecified: Secondary | ICD-10-CM

## 2020-11-19 DIAGNOSIS — G8929 Other chronic pain: Secondary | ICD-10-CM | POA: Diagnosis not present

## 2020-11-19 LAB — HEMOGLOBIN A1C: Hgb A1c MFr Bld: 5.8 % (ref 4.6–6.5)

## 2020-11-19 LAB — PSA: PSA: 0.95 ng/mL (ref 0.10–4.00)

## 2020-11-19 NOTE — Patient Instructions (Signed)
A few things to remember from today's visit:   Chronic right-sided low back pain without sciatica  Urine frequency - Plan: Hemoglobin A1c, PSA   Do not use My Chart to request refills or for acute issues that need immediate attention.  Please be sure medication list is accurate. If a new problem present, please set up appointment sooner than planned today.

## 2020-11-19 NOTE — Progress Notes (Signed)
Chief Complaint  Patient presents with   Back Pain    Seen on 09/29/20. He has tried PT, massage, creams like iceyhot- these things help temporarily. Increased urination and pressure in the back when urinating- concerned about the prostate. He drives heavy equipment at work.   HPI: Jermaine Hill is a 45 y.o. male with hx of chronic back pain here today complaining of persistent back pain as described above. He was seen on 09/29/20,. He is concerned about prostate or kidney disease being the cause of back pain.  Bilateral back pain, R>L,not radiated. Exacerbated by prolonged sitting. Alleviated by changing position. Pain has been stable for years. No recent trauma. Denies saddle anesthesia,LE numbness/tingling,and bowel/bladder dysfunction. He tries not to take oral medication for pain, afraid of side effects.  He completed PT,TENS, and massage help.  Negative for fever,chills,abnormal wt loss, night sweats,abdominal pain,N/V,or skin rash.  03/22/20 lumbar X ray:No fracture or spondylolisthesis.  No evident arthropathy  -Frequent urination. Nocturia x 2-3. Problem has been stable for a while. It is not affecting productivity at work. Denies dysuria, gross hematuria,or decreased urine output. No urine incontinence.  Component     Latest Ref Rng & Units 03/22/2020  Color, Urine     Yellow;Lt. Yellow;Straw;Dark Yellow;Amber;Green;Red;Brown YELLOW (A)  Appearance     Clear;Turbid;Slightly Cloudy;Cloudy CLEAR (A)  Specific Gravity, Urine     1.000 - 1.030 1.024  pH     5.0 - 8.0 5.0  Glucose, UA     NEGATIVE mg/dL NEGATIVE  Hgb urine dipstick     Negative NEGATIVE  Bilirubin Urine     Negative NEGATIVE  Ketones, ur     Negative NEGATIVE  Protein     NEGATIVE mg/dL NEGATIVE  Nitrite     Negative NEGATIVE  Leukocytes,Ua     Negative NEGATIVE  RBC / HPF     0-2/hpf 0-5  WBC, UA     0-2/hpf 0-5  Bacteria, UA     NONE SEEN NONE SEEN  Squamous Epithelial / LPF      0 - 5 NONE SEEN  Mucus      PRESENT   Lab Results  Component Value Date   CREATININE 0.96 09/29/2020   BUN 18 09/29/2020   NA 138 09/29/2020   K 3.9 09/29/2020   CL 105 09/29/2020   CO2 27 09/29/2020   Review of Systems  Constitutional:  Negative for appetite change, chills and fatigue.  Gastrointestinal:        Negative for changes in bowel habits.  Endocrine: Negative for polydipsia, polyphagia and polyuria.  Genitourinary:  Negative for difficulty urinating, penile discharge, penile pain, scrotal swelling and urgency.  Musculoskeletal:  Negative for arthralgias and gait problem.  Neurological:  Negative for syncope and weakness.  Psychiatric/Behavioral:  Negative for behavioral problems and confusion.   Rest see pertinent positives and negatives per HPI.  No current outpatient medications on file prior to visit.   No current facility-administered medications on file prior to visit.   Past Medical History:  Diagnosis Date   Back pain    No Known Allergies  Social History   Socioeconomic History   Marital status: Married    Spouse name: Not on file   Number of children: Not on file   Years of education: Not on file   Highest education level: Not on file  Occupational History   Not on file  Tobacco Use   Smoking status: Never   Smokeless tobacco: Never  Substance and Sexual Activity   Alcohol use: Yes    Comment: socially   Drug use: Never   Sexual activity: Yes  Other Topics Concern   Not on file  Social History Narrative   ** Merged History Encounter **       Social Determinants of Health   Financial Resource Strain: Not on file  Food Insecurity: Not on file  Transportation Needs: Not on file  Physical Activity: Not on file  Stress: Not on file  Social Connections: Not on file   Vitals:   11/19/20 1142  BP: 120/80  Pulse: 73  Resp: 18  Temp: 98 F (36.7 C)  SpO2: 97%   Body mass index is 22.3 kg/m.  Physical Exam Nursing note  reviewed.  Constitutional:      General: He is not in acute distress.    Appearance: He is well-developed.  HENT:     Head: Normocephalic and atraumatic.  Eyes:     Conjunctiva/sclera: Conjunctivae normal.  Cardiovascular:     Rate and Rhythm: Normal rate and regular rhythm.     Pulses:          Dorsalis pedis pulses are 2+ on the right side and 2+ on the left side.     Heart sounds: No murmur heard. Pulmonary:     Effort: Pulmonary effort is normal. No respiratory distress.     Breath sounds: Normal breath sounds.  Abdominal:     Palpations: Abdomen is soft. There is no hepatomegaly or mass.     Tenderness: There is no abdominal tenderness.  Musculoskeletal:     Lumbar back: No spasms or tenderness. Negative right straight leg raise test and negative left straight leg raise test.  Lymphadenopathy:     Cervical: No cervical adenopathy.  Skin:    General: Skin is warm.     Findings: No erythema or rash.  Neurological:     Mental Status: He is alert and oriented to person, place, and time.     Cranial Nerves: No cranial nerve deficit.     Gait: Gait normal.     Deep Tendon Reflexes:     Reflex Scores:      Patellar reflexes are 2+ on the right side and 2+ on the left side. Psychiatric:     Comments: Well groomed, good eye contact.    ASSESSMENT AND PLAN:  Jermaine Hill was seen today for back pain.  Diagnoses and all orders for this visit: Orders Placed This Encounter  Procedures   Hemoglobin A1c   PSA   Lab Results  Component Value Date   PSA 0.95 11/19/2020    Lab Results  Component Value Date   HGBA1C 5.8 11/19/2020   Chronic right-sided low back pain without sciatica We discussed prognosis, this is a chronic problem, most likely it is not going to resolved for the contrary it may get better through the years. Hx and examination do not suggest a serious process. He prefers not to take NSAID's or muscle relaxants. Continue with PT exercising at home,  ergonomics at work,and local massage. Icy hot patch may also help.  Instructed about warning signs. F/U as needed.   Urine frequency We discussed possible etiologies. Prefers blood work instead rectal examination, ? BPH. Hx does not suggest a serious process. We discussed options: Pharmacologic treatment with vesicare vs urology evaluation, he prefers to hold on both for now. Monitor for new symptoms.   Return if symptoms worsen or fail to improve.  Jermaine Vos G. [place], MD  [facility]. [place] office.   A few things to remember from today's visit:   Chronic right-sided low back pain without sciatica  Urine frequency - Plan: Hemoglobin A1c, PSA   Do not use My Chart to request refills or for acute issues that need immediate attention.  Please be sure medication list is accurate. If a new problem present, please set up appointment sooner than planned today.

## 2020-11-22 ENCOUNTER — Encounter: Payer: Self-pay | Admitting: Family Medicine

## 2020-11-26 ENCOUNTER — Encounter: Payer: 59 | Admitting: Physical Therapy

## 2020-12-03 ENCOUNTER — Encounter: Payer: 59 | Admitting: Physical Therapy

## 2020-12-17 ENCOUNTER — Ambulatory Visit (INDEPENDENT_AMBULATORY_CARE_PROVIDER_SITE_OTHER): Payer: 59 | Admitting: Family Medicine

## 2020-12-17 ENCOUNTER — Other Ambulatory Visit: Payer: Self-pay

## 2020-12-17 ENCOUNTER — Encounter: Payer: Self-pay | Admitting: Family Medicine

## 2020-12-17 DIAGNOSIS — G8929 Other chronic pain: Secondary | ICD-10-CM

## 2020-12-17 DIAGNOSIS — M545 Low back pain, unspecified: Secondary | ICD-10-CM | POA: Diagnosis not present

## 2020-12-17 MED ORDER — BACLOFEN 10 MG PO TABS: 5.0000 mg | ORAL_TABLET | Freq: Every evening | ORAL | 3 refills | Status: AC | PRN

## 2020-12-17 MED ORDER — BACLOFEN 10 MG PO TABS: 5.0000 mg | ORAL_TABLET | Freq: Every evening | ORAL | 3 refills | Status: DC | PRN

## 2020-12-17 NOTE — Patient Instructions (Signed)
    Try these:  Vitamin D3:  Take 5,000 IU daily  Glucosamine:  1,000 mg twice daily  Turmeric:  500 mg twice daily

## 2020-12-17 NOTE — Progress Notes (Signed)
   Office Visit Note   Patient: Jermaine Hill           Date of Birth: [birth year]           MRN: [id number] Visit Date: 12/17/2020 Requested by: [place], Betty G, MD [address] [place],  [place] [address] PCP: [place], Betty G, MD  Subjective: Chief Complaint  Patient presents with   Lower Back - Pain    Pain mainly across the lower back x 6 months. It occasionally will radiate up the spine to between the shoulder blades. 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 Has tried otc creams, massage, TENS unit.    Middle Back - Pain    HPI: He is here with low back pain.  Symptoms started about 6 to 12 months ago, no definite injury.  Gradual onset of pain in the lower back with occasional pain between the shoulder blades.  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  After about an hour and a half of driving, his back becomes very uncomfortable.  He has tried stretching at home, over-the-counter creams, a massage device and a TENS unit.  These things give him temporary relief.  He had x-rays done which I reviewed, they were unremarkable.  He has had labs drawn as well and these have been normal.  Denies radicular symptoms, fevers or chills, is otherwise been in good health.                ROS:   All other systems were reviewed and are negative.  Objective: Vital Signs: There were no vitals taken for this visit.  Physical Exam:  General:  Alert and oriented, in no acute distress. Pulm:  Breathing unlabored. Psy:  Normal mood, congruent affect.  Low back: He has no scoliosis.  He is able to bend forward without severe pain.  Hyperextension does not cause severe pain.  He is tender to palpation near the midline in the mid lumbar spine paraspinous muscles.  No pain over the SI joints.  No pain in the sciatic notch.  No pain with internal/external hip rotation.  He has extremely tight hamstrings and heel cords bilaterally.  Lower extremity strength and reflexes are  normal.    Imaging: No results found.  Assessment & Plan: Chronic low back pain, probably related to inflexibility -Home stretches and core strengthening exercises given.  Baclofen at night as needed.  Vitamin D3, glucosamine and turmeric. -Consider MRI scan if he fails to improve.     Procedures: No procedures performed        PMFS History: There are no problems to display for this patient.  Past Medical History:  Diagnosis Date   Back pain     Family History  Problem Relation Age of Onset   Hypertension Mother    Diabetes Sister    Alcohol abuse Brother    Arthritis Paternal Grandmother    Asthma Paternal Grandfather    Early death Paternal Grandfather     Past Surgical History:  Procedure Laterality Date   APPENDECTOMY     Social History   Occupational History   Not on file  Tobacco Use   Smoking status: Never   Smokeless tobacco: Never  Substance and Sexual Activity   Alcohol use: Yes    Comment: socially   Drug use: Never   Sexual activity: Yes

## 2021-01-19 ENCOUNTER — Ambulatory Visit (INDEPENDENT_AMBULATORY_CARE_PROVIDER_SITE_OTHER): Payer: 59 | Admitting: Family Medicine

## 2021-01-19 ENCOUNTER — Encounter: Payer: Self-pay | Admitting: Family Medicine

## 2021-01-19 ENCOUNTER — Other Ambulatory Visit: Payer: Self-pay

## 2021-01-19 DIAGNOSIS — G8929 Other chronic pain: Secondary | ICD-10-CM

## 2021-01-19 DIAGNOSIS — M545 Low back pain, unspecified: Secondary | ICD-10-CM | POA: Diagnosis not present

## 2021-01-19 NOTE — Progress Notes (Signed)
   Office Visit Note   Patient: Jermaine Hill           Date of Birth: March 30, 1976           MRN: [id number] Visit Date: 01/19/2021 Requested by: [place], Betty G, MD [address] [place],  [place] [address] PCP: [place], Betty G, MD  Subjective: Chief Complaint  Patient presents with   Lower Back - Pain, Follow-up    Has tried the supplements/medications. The pain has not improved.   Middle Back - Pain, Follow-up    HPI: He is here for follow-up chronic low back pain.  No change in his symptoms with baclofen, vitamin D, glucosamine and turmeric.  Pain daily, and it wakes him up at night.  No fevers or chills.  No radicular symptoms.                ROS:   All other systems were reviewed and are negative.  Objective: Vital Signs: There were no vitals taken for this visit.  Physical Exam:  General:  Alert and oriented, in no acute distress. Pulm:  Breathing unlabored. Psy:  Normal mood, congruent affect. Skin: No rash Low back: Tender in the right sided paraspinous muscles from around L3-S1.  No bony tenderness over the spinous processes.  Straight leg raise, lower extremity strength and reflexes are normal.    Imaging: No results found.  Assessment & Plan: Chronic low back pain -We will proceed with MRI scan.  Depending on the results, could contemplate another trial of physical therapy or possibly chiropractic.     Procedures: No procedures performed        PMFS History: There are no problems to display for this patient.  Past Medical History:  Diagnosis Date   Back pain     Family History  Problem Relation Age of Onset   Hypertension Mother    Diabetes Sister    Alcohol abuse Brother    Arthritis Paternal Grandmother    Asthma Paternal Grandfather    Early death Paternal Grandfather     Past Surgical History:  Procedure Laterality Date   APPENDECTOMY     Social History   Occupational History   Not on file  Tobacco Use   Smoking status:  Never   Smokeless tobacco: Never  Substance and Sexual Activity   Alcohol use: Yes    Comment: socially   Drug use: Never   Sexual activity: Yes

## 2021-01-28 ENCOUNTER — Ambulatory Visit
Admission: RE | Admit: 2021-01-28 | Discharge: 2021-01-28 | Disposition: A | Discharge status: Home / Self Care | Payer: 59 | Source: Ambulatory Visit | Attending: Family Medicine | Admitting: Family Medicine

## 2021-01-28 ENCOUNTER — Other Ambulatory Visit: Payer: Self-pay

## 2021-01-28 DIAGNOSIS — G8929 Other chronic pain: Secondary | ICD-10-CM

## 2021-01-28 DIAGNOSIS — M545 Low back pain, unspecified: Secondary | ICD-10-CM

## 2021-01-29 ENCOUNTER — Telehealth: Payer: Self-pay | Admitting: Family Medicine

## 2021-01-29 NOTE — Telephone Encounter (Signed)
I called the patient to give him these MRI results. He can speak [place], but prefers Spanish in order to fully understand. Would you please call this patient?

## 2021-01-29 NOTE — Telephone Encounter (Signed)
Lumbar MRI scan is notable for a disc protrusion at L3-4 toward the left which is touching the left L3 nerve root.  There is another disc bulge at L4-5 but it is not causing any nerve impingement.  Could contemplate another trial of physical therapy aimed at repositioning these discs.  Chiropractic would be another consideration.  Epidural steroid injection is also an option.  Surgery only if conservative measures fail.

## 2021-01-30 NOTE — Telephone Encounter (Signed)
The patient came in today with his wife, who was seeing Dr. Prince Rome for an office visit. There was a [place] interpreter here for the wife. I asked if she would please translate these results for the patient - she did so.

## 2021-03-13 ENCOUNTER — Ambulatory Visit: Payer: Self-pay

## 2021-03-13 ENCOUNTER — Other Ambulatory Visit: Payer: Self-pay

## 2021-03-13 ENCOUNTER — Ambulatory Visit (INDEPENDENT_AMBULATORY_CARE_PROVIDER_SITE_OTHER): Payer: 59 | Admitting: Orthopaedic Surgery

## 2021-03-13 ENCOUNTER — Encounter: Payer: Self-pay | Admitting: Orthopaedic Surgery

## 2021-03-13 VITALS — BP 119/75 | Ht 70.0 in | Wt 155.0 lb

## 2021-03-13 DIAGNOSIS — M549 Dorsalgia, unspecified: Secondary | ICD-10-CM | POA: Diagnosis not present

## 2021-03-13 DIAGNOSIS — M542 Cervicalgia: Secondary | ICD-10-CM | POA: Diagnosis not present

## 2021-03-13 NOTE — Progress Notes (Signed)
Office Visit Note   Patient: Jermaine Hill           Date of Birth: 1975-07-27           MRN: [id number] Visit Date: 03/13/2021              Requested by: [place], Betty G, MD [address] [place],  [place] [address] PCP: [place], Betty G, MD   Assessment & Plan: Visit Diagnoses:  1. Neck pain   2. Mid back pain     Plan: We will set patient up for some cervical traction that he can use at home since he gets relief with distraction and increase with compression.  He likely has some mild disc protrusion of the cervical spine with the pain that radiates in the shoulders and scapular region.  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  We will check him in 4 weeks and if he is having persistent symptoms we may consider cervical MRI scan.  Follow-Up Instructions: Return in about 4 weeks (around 04/10/2021).   Orders:  Orders Placed This Encounter  Procedures   XR Cervical Spine 2 or 3 views   XR Thoracic Spine 2 View   No orders of the defined types were placed in this encounter.     Procedures: No procedures performed   Clinical Data: No additional findings.   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  He states he works at night and hurts pretty much most of the time.  When he gets home he has difficulty sleeping.  He has some pain mid to upper lumbar region also pain between the shoulder blades.  Some discomfort in his neck that radiates into her shoulders.  He describes it as feeling heavy and uncomfortable aching.  When he turns or twists he has increased pain.  He is use a TENS unit menthol cream, anti-inflammatories.  He has been on baclofen in the past.  He is also noticed discomfort that radiates in his  left anterior thigh stops at the knee.  Slight numbness noted in that area.  MRI scan showed some L3-4 lateral disc protrusion touching the nerve root.  No lateral recess or from central compression.  Patient has some discomfort between his shoulder blades when he carries his low-month-old child.  Review of Systems all the systems noncontributory to HPI.   Objective: Vital Signs: BP 119/75   Ht 5\' 10"  (1.778 m)   Wt 155 lb (70.3 kg)   BMI 22.24 kg/m   Physical Exam Constitutional:      Appearance: He is well-developed.  HENT:     Head: Normocephalic and atraumatic.     Right Ear: External ear normal.     Left Ear: External ear normal.  Eyes:     Pupils: Pupils are equal, round, and reactive to light.  Neck:     Thyroid: No thyromegaly.     Trachea: No tracheal deviation.  Cardiovascular:     Rate and Rhythm: Normal rate.  Pulmonary:     Effort: Pulmonary effort is normal.     Breath sounds: No wheezing.  Abdominal:     General: Bowel sounds are normal.     Palpations: Abdomen is soft.  Musculoskeletal:     Cervical back: Neck supple.  Skin:    General: Skin is warm and dry.     Capillary Refill: Capillary refill takes less than 2 seconds.  Neurological:     Mental Status: He is alert and oriented to person, place, and time.  Psychiatric:        Behavior: Behavior normal.        Thought Content: Thought content normal.        Judgment: Judgment normal.    Ortho Exam patient has intact upper lower extremity reflexes.  No hip flexion weakness.  There is bilateral brachial plexus tenderness increased pain with cervical compression some relief with distraction.  More brachial plexus tenderness on the right than left positive Spurling on the right.  No atrophy in the upper extremities.  Some tenderness of the mid thoracic spine and palpation between the scapula.  No winging.  Specialty Comments:  No specialty comments available.  Imaging: CLINICAL DATA:  Low back  pain.   EXAM: MRI LUMBAR SPINE WITHOUT CONTRAST   TECHNIQUE: Multiplanar, multisequence MR imaging of the lumbar spine was performed. No intravenous contrast was administered.   COMPARISON:  Radiographs March 22, 2020   FINDINGS: Segmentation:  Standard.   Alignment:  Physiologic.   Vertebrae:  No fracture, evidence of discitis, or bone lesion.   Conus medullaris and cauda equina: Conus extends to the L1 level. Conus and cauda equina appear normal.   Paraspinal and other soft tissues: Negative.   Disc levels:   T12-L1: No spinal canal or neural foraminal stenosis.   L1-2: No spinal canal or neural foraminal stenosis.   L2-3: No spinal canal or neural foraminal stenosis.   L3-4: Small left foraminal/far lateral disc protrusion resulting in mild left neural foraminal narrowing and abutting the exiting left L3 nerve root. No spinal canal stenosis.   L4-5: Shallow disc bulge with a right foraminal annular tear, mild facet degenerative changes with mild right joint effusion and mild ligamentum flavum redundancy without significant spinal canal or neural foraminal.   L5-S1: Mild facet degenerative changes. No spinal canal or neural foraminal stenosis.   IMPRESSION: Small left foraminal/far lateral disc protrusion at L3-4 resulting in mild left neural foraminal narrowing and abutting the exiting left L3 nerve root.     Electronically Signed   By: Baldemar Lenis M.D.   On: 01/28/2021 17:08   PMFS History: There are no problems to display for this patient.  Past Medical History:  Diagnosis Date   Back pain     Family History  Problem Relation Age of Onset   Hypertension Mother    Diabetes Sister    Alcohol abuse Brother    Arthritis Paternal Grandmother    Asthma Paternal Grandfather    Early death Paternal Grandfather     Past Surgical History:  Procedure Laterality Date   APPENDECTOMY     Social History   Occupational History    Not on file  Tobacco Use   Smoking status: Never   Smokeless tobacco: Never  Substance and Sexual Activity   Alcohol use: Yes    Comment: socially   Drug use: Never   Sexual activity: Yes

## 2021-03-16 ENCOUNTER — Ambulatory Visit: Payer: 59 | Admitting: Orthopedic Surgery

## 2021-04-14 ENCOUNTER — Encounter: Payer: Self-pay | Admitting: Orthopaedic Surgery

## 2021-04-14 ENCOUNTER — Ambulatory Visit (INDEPENDENT_AMBULATORY_CARE_PROVIDER_SITE_OTHER): Payer: BC Managed Care – PPO | Admitting: Orthopaedic Surgery

## 2021-04-14 ENCOUNTER — Other Ambulatory Visit: Payer: Self-pay

## 2021-04-14 DIAGNOSIS — M542 Cervicalgia: Secondary | ICD-10-CM | POA: Diagnosis not present

## 2021-04-14 NOTE — Progress Notes (Signed)
Office Visit Note   Patient: Jermaine Hill           Date of Birth: [birth year]           MRN: [id number] Visit Date: 04/14/2021              Requested by: [place], Betty G, MD [address] [place],  [place] [address] PCP: [place], Betty G, MD   Assessment & Plan: Visit Diagnoses:  1. Neck pain     Plan: Exam negative for radiculopathy or myelopathy.  If symptoms increase then cervical MRI will be ordered.  Follow-Up Instructions: Return if symptoms worsen or fail to improve.   Orders:  No orders of the defined types were placed in this encounter.  No orders of the defined types were placed in this encounter.     Procedures: No procedures performed   Clinical Data: No additional findings.   Subjective: Chief Complaint  Patient presents with   Neck - Pain   Middle Back - Pain   Lower Back - Pain    HPI 45 year old male 4 weeks follow-up with neck pain.  Patient been using cervical traction states he has gotten some relief.  He is sleeping in 2 to 3 hours then pain wakes him up.  He is comfortable in the fetal position.  He is noted relief when he uses a cervical traction.  Stretching does make him feel a little bit better.  When he leans over changing his child's diaper or in the garden leaning forward he notices increased pain.  He is using over-the-counter cream.  Previous x-rays showed some spurring mild at C5-6.  Review of Systems all other systems updated unchanged from 03/13/2021 office visit.   Objective: Vital Signs: BP 134/80   Pulse 69   Ht 5\' 10"  (1.778 m)   Wt 155 lb (70.3 kg)   BMI 22.24 kg/m   Physical Exam Constitutional:      Appearance: He is well-developed.  HENT:     Head: Normocephalic and atraumatic.     Right Ear: External ear normal.     Left Ear: External ear normal.  Eyes:     Pupils: Pupils are equal, round, and reactive to light.  Neck:     Thyroid: No thyromegaly.     Trachea: No tracheal deviation.  Cardiovascular:      Rate and Rhythm: Normal rate.  Pulmonary:     Effort: Pulmonary effort is normal.     Breath sounds: No wheezing.  Abdominal:     General: Bowel sounds are normal.     Palpations: Abdomen is soft.  Musculoskeletal:     Cervical back: Neck supple.  Skin:    General: Skin is warm and dry.     Capillary Refill: Capillary refill takes less than 2 seconds.  Neurological:     Mental Status: He is alert and oriented to person, place, and time.  Psychiatric:        Behavior: Behavior normal.        Thought Content: Thought content normal.        Judgment: Judgment normal.    Ortho Exam reflexes are 2+ and symmetrical normal heel toe gait.  Relief with cervical distraction some increased pain with compression.  Brachial plexus tenderness more on the right than left.  No upper extremity atrophy.  Specialty Comments:  No specialty comments available.  Imaging: No results found.   PMFS History: Patient Active Problem List   Diagnosis Date Noted  Neck pain 04/20/2021    Past Medical History:  Diagnosis Date   Back pain     Family History  Problem Relation Age of Onset   Hypertension Mother    Diabetes Sister    Alcohol abuse Brother    Arthritis Paternal Grandmother    Asthma Paternal Grandfather    Early death Paternal Grandfather     Past Surgical History:  Procedure Laterality Date   APPENDECTOMY     Social History   Occupational History   Not on file  Tobacco Use   Smoking status: Never   Smokeless tobacco: Never  Substance and Sexual Activity   Alcohol use: Yes    Comment: socially   Drug use: Never   Sexual activity: Yes

## 2021-04-20 DIAGNOSIS — M542 Cervicalgia: Secondary | ICD-10-CM | POA: Insufficient documentation

## 2021-09-23 ENCOUNTER — Ambulatory Visit
Admission: EM | Admit: 2021-09-23 | Discharge: 2021-09-23 | Disposition: A | Discharge status: Home / Self Care | Payer: BC Managed Care – PPO | Attending: Family Medicine | Admitting: Family Medicine

## 2021-09-23 DIAGNOSIS — H1033 Unspecified acute conjunctivitis, bilateral: Secondary | ICD-10-CM | POA: Diagnosis not present

## 2021-09-23 MED ORDER — POLYMYXIN B-TRIMETHOPRIM 10000-0.1 UNIT/ML-% OP SOLN: 2.0000 [drp] | Freq: Three times a day (TID) | OPHTHALMIC | 0 refills | Status: AC

## 2021-09-23 NOTE — ED Provider Notes (Signed)
?[facility] ? ? ? ?CSN: [id number] ?Arrival date & time: 09/23/21  1310 ? ? ?  ? ?History   ?Chief Complaint ?Chief Complaint  ?Patient presents with  ? Conjunctivitis  ? ? ?HPI ?Jermaine Hill is a 46 y.o. male.  ? ?HPI ?Patient presents today with a 4-day history of bilateral eye itching, irritation and drainage.  He reports the symptoms initially started in the left eye and now involve both eyes.  He denies any known irritant or sick contact.  He denies any history of recurrent eye infections.  He endorses with discharge she is unable to see clearly however after moving discharge she can see at his baseline.  He denies any other upper respiratory symptoms ?Past Medical History:  ?Diagnosis Date  ? Back pain   ? ? ?Patient Active Problem List  ? Diagnosis Date Noted  ? Neck pain 04/20/2021  ? ? ?Past Surgical History:  ?Procedure Laterality Date  ? APPENDECTOMY    ? ? ? ? ? ?Home Medications   ? ?Prior to Admission medications   ?Medication Sig Start Date End Date Taking? Authorizing Provider  ?trimethoprim-polymyxin b (POLYTRIM) ophthalmic solution Place 2 drops into both eyes in the morning, at noon, and at bedtime for 5 days. 09/23/21 09/28/21 Yes Scot Jun, FNP  ?baclofen (LIORESAL) 10 MG tablet Take 0.5-1 tablets (5-10 mg total) by mouth at bedtime as needed for muscle spasms. ?Patient not taking: Reported on 03/13/2021 12/17/20   Eunice Blase, MD  ? ? ?Family History ?Family History  ?Problem Relation Age of Onset  ? Hypertension Mother   ? Diabetes Sister   ? Alcohol abuse Brother   ? Arthritis Paternal Grandmother   ? Asthma Paternal Grandfather   ? Early death Paternal Grandfather   ? ? ?Social History ?Social History  ? ?Tobacco Use  ? Smoking status: Never  ? Smokeless tobacco: Never  ?Substance Use Topics  ? Alcohol use: Yes  ?  Comment: socially  ? Drug use: Never  ? ? ? ?Allergies   ?Patient has no known allergies. ? ?Review of Systems ?Review of Systems ?Pertinent negatives listed  in HPI  ? ?Physical Exam ?Triage Vital Signs ?ED Triage Vitals [09/23/21 1351]  ?Enc Vitals Group  ?   BP (!) 143/89  ?   Pulse Rate 77  ?   Resp 18  ?   Temp 98 ?F (36.7 ?C)  ?   Temp Source Oral  ?   SpO2 98 %  ?   Weight   ?   Height   ?   Head Circumference   ?   Peak Flow   ?   Pain Score 0  ?   Pain Loc   ?   Pain Edu?   ?   Excl. in [place]?   ? ?No data found. ? ?Updated Vital Signs ?BP (!) 143/89 (BP Location: Left Arm)   Pulse 77   Temp 98 ?F (36.7 ?C) (Oral)   Resp 18   SpO2 98%  ? ?Visual Acuity ?Right Eye Distance: 20/50 ?Left Eye Distance: 20/25 ?Bilateral Distance: 20/25 ? ?Right Eye Near:   ?Left Eye Near:    ?Bilateral Near:    ? ?Physical Exam ?Constitutional:   ?   Appearance: Normal appearance.  ?HENT:  ?   Nose: Nose normal.  ?Eyes:  ?   General:     ?   Right eye: Discharge present.     ?   Left eye:  Discharge present. ?   Extraocular Movements: Extraocular movements intact.  ?   Pupils: Pupils are equal, round, and reactive to light.  ?Cardiovascular:  ?   Rate and Rhythm: Normal rate and regular rhythm.  ?Pulmonary:  ?   Effort: Pulmonary effort is normal.  ?   Breath sounds: Normal breath sounds.  ?Musculoskeletal:  ?   Cervical back: Normal range of motion.  ?Skin: ?   General: Skin is warm and dry.  ?   Capillary Refill: Capillary refill takes less than 2 seconds.  ?Neurological:  ?   General: No focal deficit present.  ?   Mental Status: He is alert and oriented to person, place, and time.  ?Psychiatric:     ?   Mood and Affect: Mood normal.  ? ? ? ?UC Treatments / Results  ?Labs ?(all labs ordered are listed, but only abnormal results are displayed) ?Labs Reviewed - No data to display ? ?EKG ? ? ?Radiology ?No results found. ? ?Procedures ?Procedures (including critical care time) ? ?Medications Ordered in UC ?Medications - No data to display ? ?Initial Impression / Assessment and Plan / UC Course  ?I have reviewed the triage vital signs and the nursing notes. ? ?Pertinent labs & imaging  results that were available during my care of the patient were reviewed by me and considered in my medical decision making (see chart for details). ? ?  ?Acute bacterial conjunctivitis involving both eyes ?Treatment with Polytrim 2 drops in both eyes 3 times daily x5 days ?Return if symptoms worsen or do not readily improve. ?Final Clinical Impressions(s) / UC Diagnoses  ? ?Final diagnoses:  ?Acute bacterial conjunctivitis of both eyes  ? ?Discharge Instructions   ?None ?  ? ?ED Prescriptions   ? ? Medication Sig Dispense Auth. Provider  ? trimethoprim-polymyxin b (POLYTRIM) ophthalmic solution Place 2 drops into both eyes in the morning, at noon, and at bedtime for 5 days. 10 mL Scot Jun, FNP  ? ?  ? ?PDMP not reviewed this encounter. ?  ?Scot Jun, FNP ?09/23/21 1436 ? ?

## 2021-09-23 NOTE — ED Triage Notes (Signed)
Pt c/o bilateral conjunctivitis onset ~Sunday states started in left eye now in both. Denies pain but does have change in vision, burning sensation, clear drainage, discharge specifically in the morning.  ?

## 2021-10-25 IMAGING — CR DG LUMBAR SPINE 2-3V
1 series · 3 of 3 positions shown · non-contrast
Comparison: None.

CLINICAL DATA: Low back pain

EXAM:
LUMBAR SPINE - 2-3 VIEW

[Series 1: dg lumbar spine 2-3 views · 0.14mm/px · 3 of 3 slices shown]
[im 1/3]
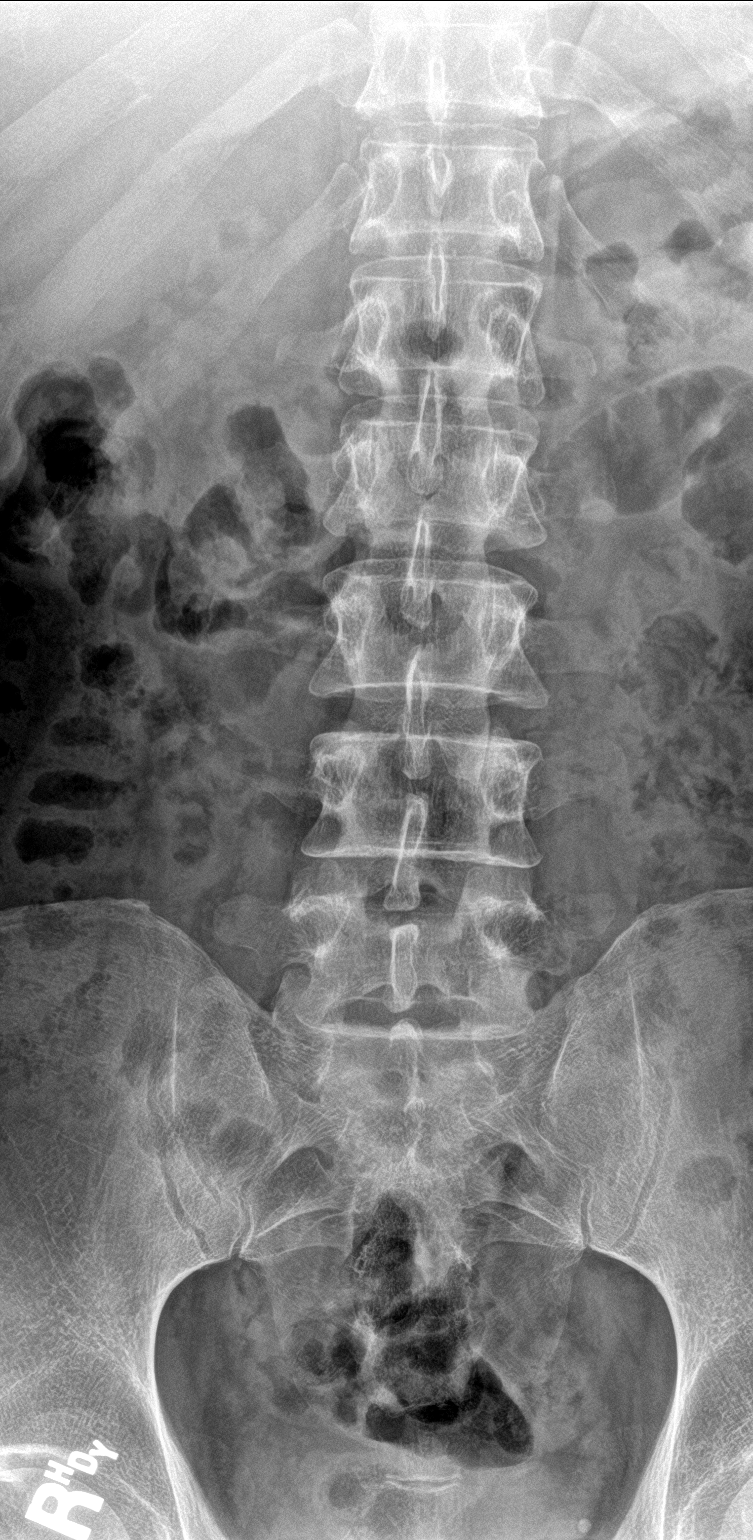
[im 2/3]
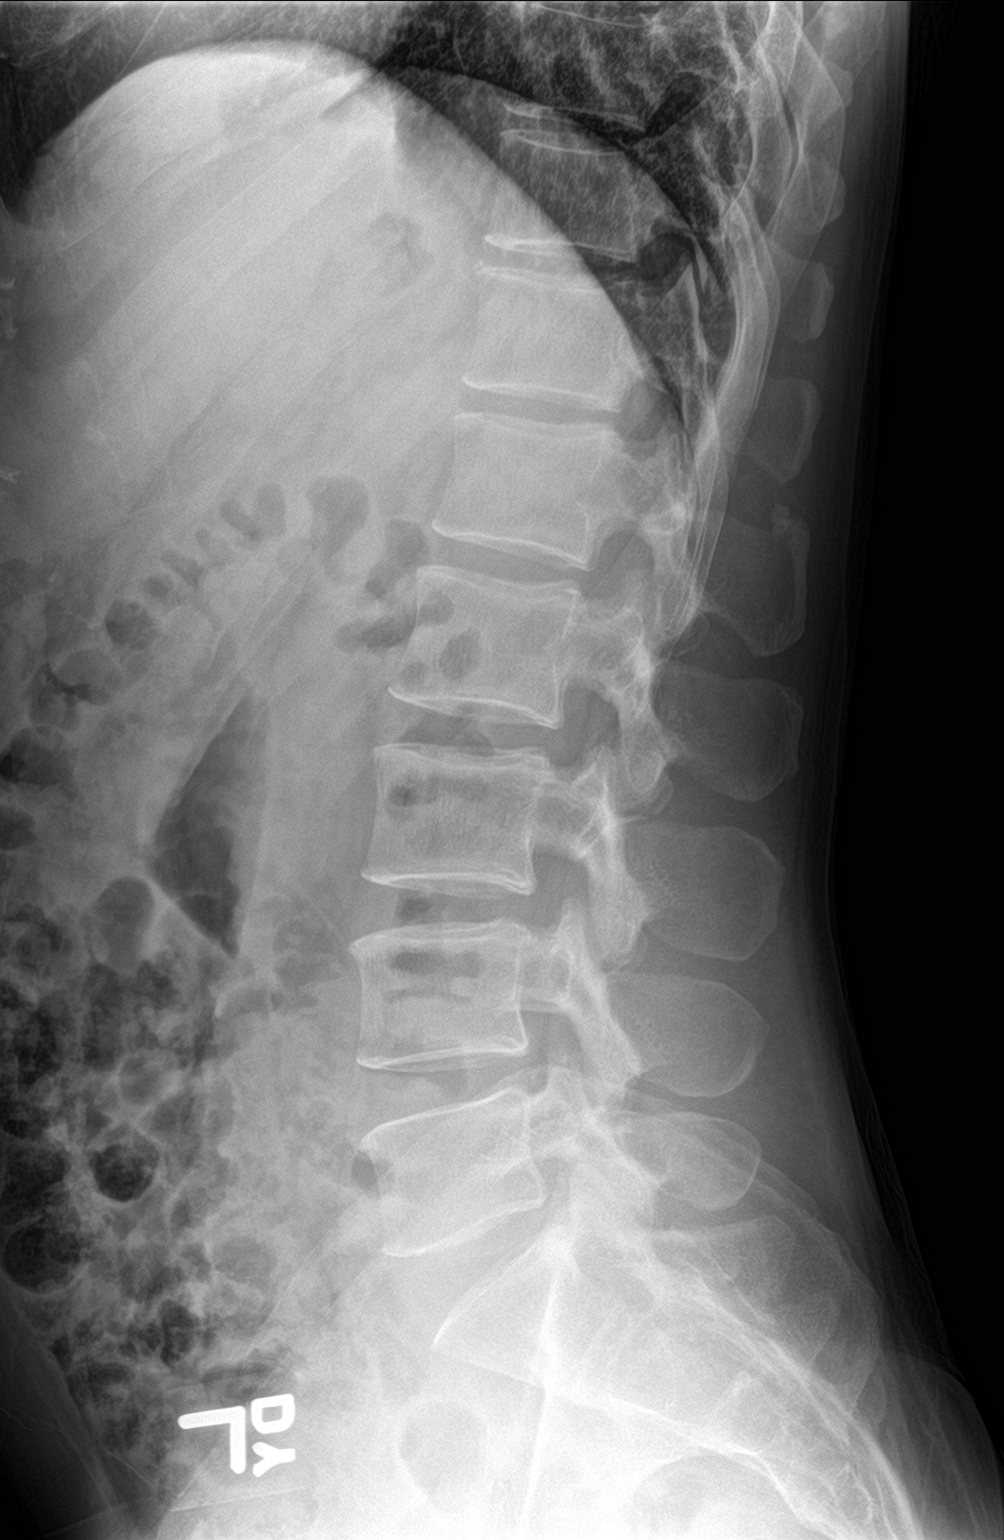
[im 3/3]
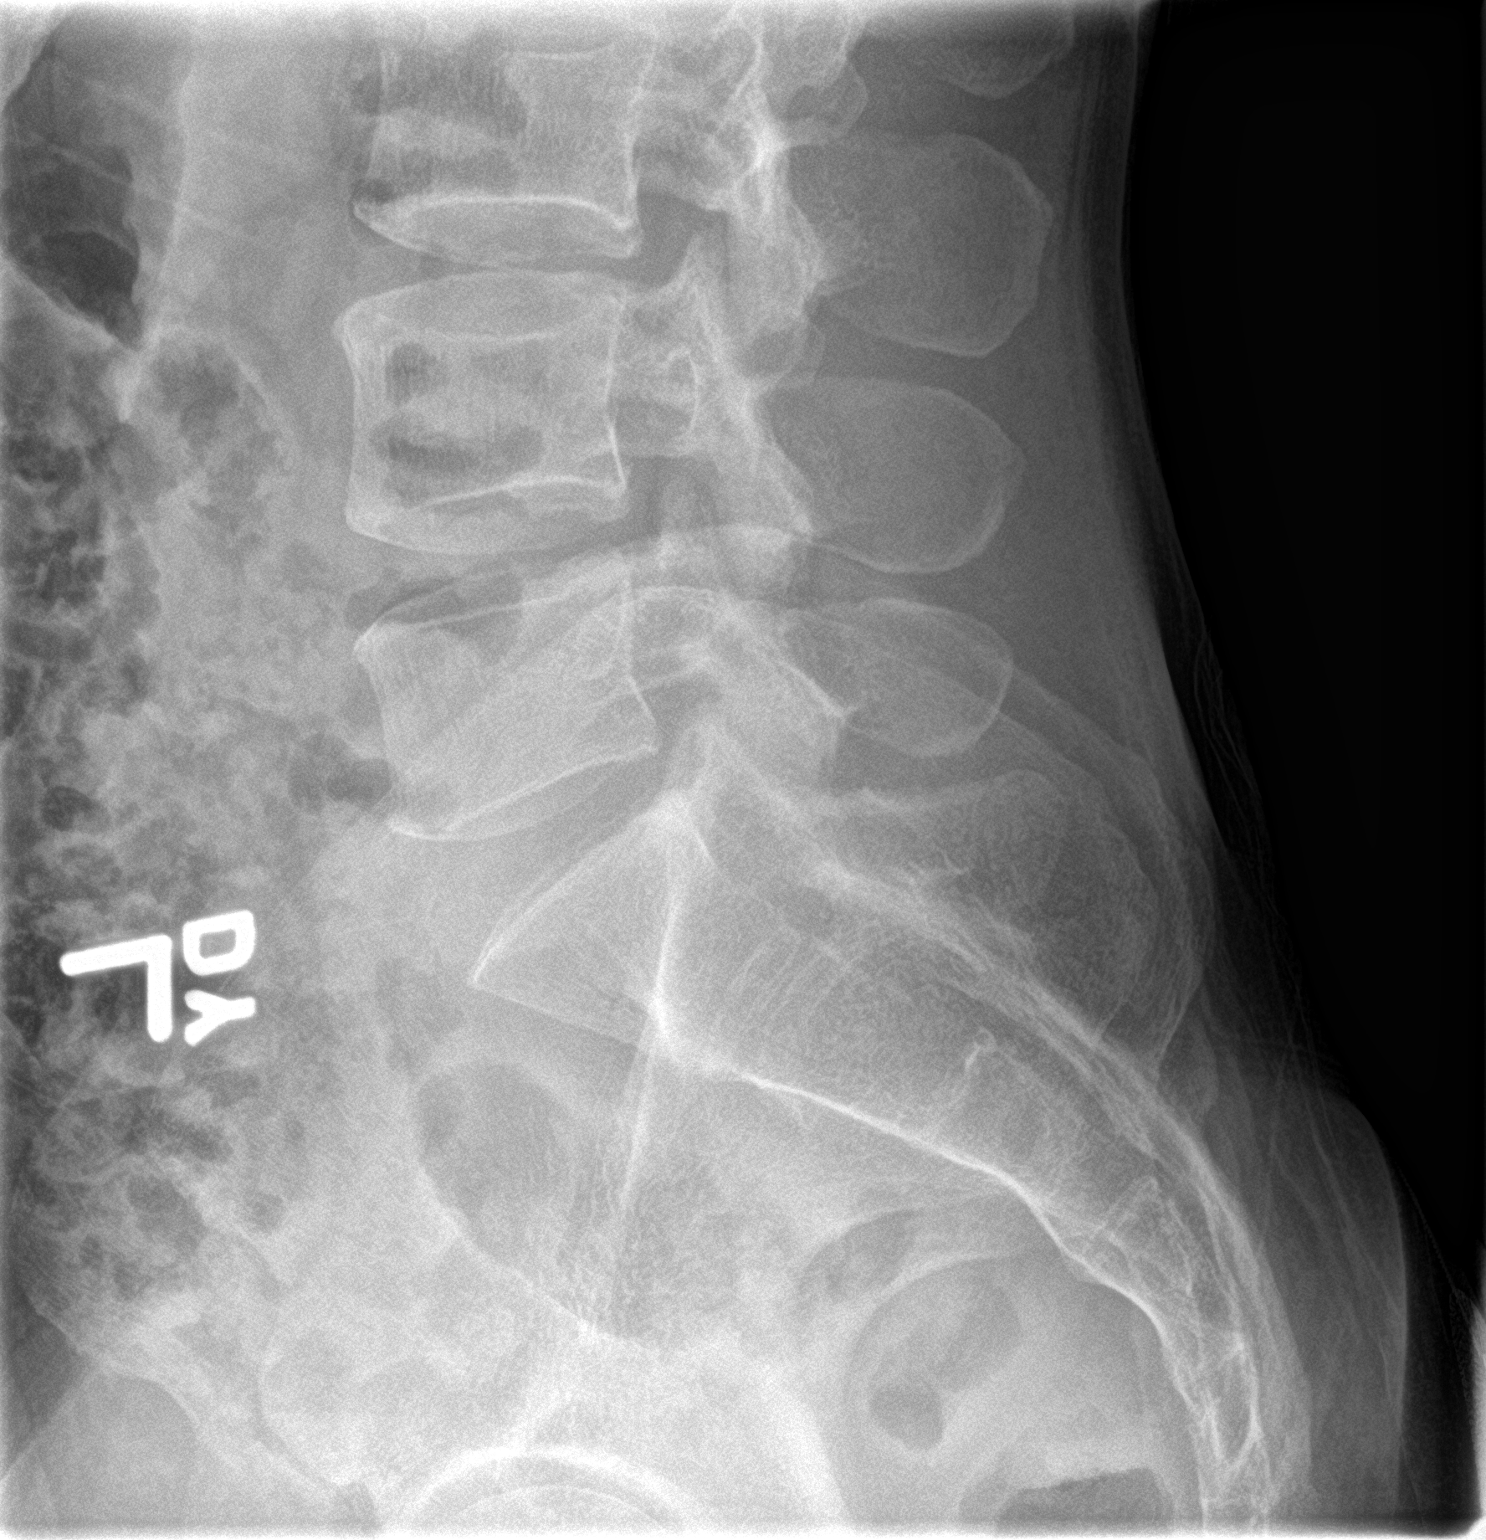

[3 of 3 positions shown; findings below may reference images not displayed]

FINDINGS: Frontal, lateral, and spot lumbosacral lateral images were obtained.
There are 5 non-rib-bearing lumbar type vertebral bodies. There is
no fracture or spondylolisthesis. The disc spaces appear normal. No
appreciable erosive change.
IMPRESSION: No fracture or spondylolisthesis.  No evident arthropathy

## 2023-07-17 DIAGNOSIS — T498X5A Adverse effect of other topical agents, initial encounter: Secondary | ICD-10-CM | POA: Diagnosis not present

## 2023-10-03 ENCOUNTER — Ambulatory Visit: Payer: Self-pay | Admitting: Family Medicine

## 2023-10-03 NOTE — Telephone Encounter (Signed)
 Chief Complaint: Testicle pain x6 months  Symptoms: 7/10 intermittent pain Pertinent Negatives: Patient denies swelling, redness, abdomen pain, difficulty passing urine  Disposition: [x] Appointment (In office) Additional Notes: Spoke with pt using an Interpreter Claudis Cumber, # [phone]). Pt scheduled for an appointment with PCP tomorrow in office. This RN educated pt on new-worsening symptoms and when to call back/seek emergent care. Pt verbalized understanding and agrees to plan.     Copied from CRM [id number]. Topic: Clinical - Red Word Triage >> Oct 03, 2023  9:55 AM Baldomero Bone wrote: Red Word that prompted transfer to Nurse Triage: check blood sugar and pain in testicles; started 6 months ago but getting worse Reason for Disposition  [1] Pain comes and goes (intermittent) AND [2] present > 24 hours  Answer Assessment - Initial Assessment Questions LOCATION and RADIATION: "Where is the pain located?"      Testicle pain  QUALITY: "What does the pain feel like?"  (e.g., sharp, dull, aching, burning)    " When pee have to strain, lifting heavy feel something down there" SEVERITY: "How bad is the pain?"  (Scale 1-10; or mild, moderate, severe)   - MILD (1-3): doesn't interfere with normal activities    - MODERATE (4-7): interferes with normal activities (e.g., work or school) or awakens from sleep   - SEVERE (8-10): excruciating pain, unable to do any normal activities, difficulty walking     7/10 pain ONSET: "When did the pain start?"     6 months ago PATTERN: "Does it come and go, or has it been constant since it started?"     Comes and goes SCROTAL APPEARANCE: "What does the scrotum look like?" "Is there any swelling or redness?"      Denies  Protocols used: Scrotum Pain-A-AH

## 2023-10-04 ENCOUNTER — Encounter: Payer: Self-pay | Admitting: Family Medicine

## 2023-10-04 ENCOUNTER — Ambulatory Visit (INDEPENDENT_AMBULATORY_CARE_PROVIDER_SITE_OTHER): Admitting: Family Medicine

## 2023-10-04 VITALS — BP 130/90 | HR 73 | Temp 98.0°F | Resp 12 | Ht 70.0 in | Wt 172.2 lb

## 2023-10-04 DIAGNOSIS — N50812 Left testicular pain: Secondary | ICD-10-CM

## 2023-10-04 DIAGNOSIS — R350 Frequency of micturition: Secondary | ICD-10-CM | POA: Diagnosis not present

## 2023-10-04 DIAGNOSIS — H1013 Acute atopic conjunctivitis, bilateral: Secondary | ICD-10-CM

## 2023-10-04 DIAGNOSIS — Z1211 Encounter for screening for malignant neoplasm of colon: Secondary | ICD-10-CM

## 2023-10-04 DIAGNOSIS — M545 Low back pain, unspecified: Secondary | ICD-10-CM | POA: Diagnosis not present

## 2023-10-04 DIAGNOSIS — N50811 Right testicular pain: Secondary | ICD-10-CM

## 2023-10-04 DIAGNOSIS — R7303 Prediabetes: Secondary | ICD-10-CM | POA: Insufficient documentation

## 2023-10-04 DIAGNOSIS — Z1159 Encounter for screening for other viral diseases: Secondary | ICD-10-CM

## 2023-10-04 DIAGNOSIS — G8929 Other chronic pain: Secondary | ICD-10-CM

## 2023-10-04 MED ORDER — DULOXETINE HCL 30 MG PO CPEP: 30.0000 mg | ORAL_CAPSULE | Freq: Every day | ORAL | 1 refills | Status: AC

## 2023-10-04 MED ORDER — CROMOLYN SODIUM 4 % OP SOLN: 1.0000 [drp] | Freq: Four times a day (QID) | OPHTHALMIC | 1 refills | Status: AC | PRN

## 2023-10-04 MED ORDER — CELECOXIB 100 MG PO CAPS: 100.0000 mg | ORAL_CAPSULE | Freq: Every day | ORAL | 1 refills | Status: AC | PRN

## 2023-10-04 NOTE — Patient Instructions (Addendum)
 A few things to remember from today's visit:  Colon cancer screening - Plan: Cologuard  Chronic bilateral low back pain, unspecified whether sciatica present - Plan: DULoxetine (CYMBALTA) 30 MG capsule  Pain in both testicles - Plan: Basic metabolic panel with GFR, CBC  Allergic conjunctivitis of both eyes - Plan: cromolyn (OPTICROM) 4 % ophthalmic solution  Prediabetes - Plan: Basic metabolic panel with GFR, Hemoglobin A1c  Encounter for HCV screening test for low risk patient - Plan: Hepatitis C antibody  Urinary frequency - Plan: Urinalysis with Culture Reflex, PSA  Duloxetine 30 mg diario para tratar de ayudar con el dolor de espalda. Evite liquidos 3-4 horas anter de la hora de la cama. Celebrex diaria cuando lo necesite para dolor. Lo veo en 6 semanas. Gotas para los ojor para Scientist, research (medical). Vaselina o otra crema hydratante at rededor [employer]. Tomese la presion arterial en la casa.   If you need refills for medications you take chronically, please call your pharmacy. Do not use My Chart to request refills or for acute issues that need immediate attention. If you send a my chart message, it may take a few days to be addressed, specially if I am not in the office.  Please be sure medication list is accurate. If a new problem present, please set up appointment sooner than planned today.

## 2023-10-04 NOTE — Assessment & Plan Note (Signed)
 This is not a new problem. We discussed possible etiologies. He prefers to hold on trial of Flomax. Recommend decreasing juices and sodas intake and avoid fluids 3 to 4 hours before bedtime. Monitor for new symptoms. Further recommendation will be given according to lab results.

## 2023-10-04 NOTE — Progress Notes (Unsigned)
 ACUTE VISIT Chief Complaint  Patient presents with   Testicle Pain   HPI: Jermaine Hill is a 48 y.o. male with a PMHx significant for chronic back pain, who is here today complaining of groin pain.   Groin pain:  Patient complains of bilateral inguinal pain for about six months, it is radiated to his scrotum. Jermaine Hill says the area feels inflamed but Jermaine Hill has not noticed signs of inflammation.  Jermaine Hill has this pain frequently but not every day. It worsens with certain physical activity.  Pertinent negatives include scrotal redness, edema, saddle anesthesia, bowel/bladder dysfunction. Also complaining about increasing urinary frequency, we had discussed this problem in the past. Jermaine Hill would like a PSA done. Negative for gross hematuria, foamy urine, or decreased urine output.  Jermaine Hill mentions Jermaine Hill is getting up to go to the bathroom 2-3 times per night. Jermaine Hill frequently drinks juices and sodas late afternoon.  Lab Results  Component Value Date   PSA 0.95 11/19/2020   Back pain:  Jermaine Hill is still having problems with chronic lower back pain. Jermaine Hill has followed with orthopedics and tried physical therapy.  It occasionally radiates to his upper back, but not to his legs. Jermaine Hill has been using some creams and patches to try and manage the pain.  The pain is occasionally interfering with his sleep.  Jermaine Hill has some questions about a "stretching machine" treatment.   Lumbar MRI done in 01/2021 showed small left foraminal/far lateral disc protrusion at L3-4 resulting in mild left neural foraminal narrowing and abutting the exiting left L3 nerve root.  - Also complaining about pruritic eyes, no erythema or visual changes.  Lower eyelid skin dryness. Jermaine Hill has not tried OTC medications.  Prediabetes:  Jermaine Hill has been checking his blood sugar regularly at home and reported as "normal.  Lab Results  Component Value Date   HGBA1C 5.8 11/19/2020   Review of Systems  Constitutional:  Positive for fatigue. Negative for activity  change, appetite change and fever.  HENT:  Negative for nosebleeds, sore throat and trouble swallowing.   Respiratory:  Negative for cough, shortness of breath and wheezing.   Cardiovascular:  Negative for chest pain, palpitations and leg swelling.  Gastrointestinal:  Negative for abdominal pain, nausea and vomiting.  Endocrine: Negative for cold intolerance and heat intolerance.  Genitourinary:  Negative for genital sores, penile discharge and penile swelling.  Musculoskeletal:  Negative for gait problem.  Skin:  Negative for rash.  Neurological:  Negative for syncope, weakness and headaches.  Psychiatric/Behavioral:  Negative for confusion.   See other pertinent positives and negatives in HPI.  Current Outpatient Medications on File Prior to Visit  Medication Sig Dispense Refill   baclofen  (LIORESAL ) 10 MG tablet Take 0.5-1 tablets (5-10 mg total) by mouth at bedtime as needed for muscle spasms. (Patient not taking: Reported on 10/04/2023) 30 each 3   No current facility-administered medications on file prior to visit.   Past Medical History:  Diagnosis Date   Back pain    No Known Allergies  Social History   Socioeconomic History   Marital status: Married    Spouse name: Not on file   Number of children: Not on file   Years of education: Not on file   Highest education level: Not on file  Occupational History   Not on file  Tobacco Use   Smoking status: Never   Smokeless tobacco: Never  Substance and Sexual Activity   Alcohol use: Yes    Comment: socially  Drug use: Never   Sexual activity: Yes  Other Topics Concern   Not on file  Social History Narrative   ** Merged History Encounter **       Social Drivers of Corporate investment banker Strain: Not on file  Food Insecurity: Not on file  Transportation Needs: Not on file  Physical Activity: Not on file  Stress: Not on file  Social Connections: Not on file   Vitals:   10/04/23 1528  BP: (!) 130/90   Pulse: 73  Resp: 12  Temp: 98 F (36.7 C)  SpO2: 98%   Body mass index is 24.71 kg/m.  Physical Exam Vitals and nursing note reviewed. Exam conducted with a chaperone present.  Constitutional:      General: Jermaine Hill is not in acute distress.    Appearance: Jermaine Hill is well-developed.  HENT:     Head: Normocephalic and atraumatic.     Mouth/Throat:     Mouth: Mucous membranes are moist.  Eyes:     Conjunctiva/sclera:     Right eye: No exudate or hemorrhage.    Left eye: No exudate or hemorrhage.    Comments: Right eye pterygium.  Cardiovascular:     Rate and Rhythm: Normal rate and regular rhythm.     Pulses:          Dorsalis pedis pulses are 2+ on the right side and 2+ on the left side.     Heart sounds: No murmur heard. Pulmonary:     Effort: Pulmonary effort is normal. No respiratory distress.     Breath sounds: Normal breath sounds.  Abdominal:     Palpations: Abdomen is soft. There is no hepatomegaly or mass.     Tenderness: There is no abdominal tenderness.     Hernia: There is no hernia in the left inguinal area or right inguinal area.  Genitourinary:    Penis: Normal.      Testes:        Right: Mass or tenderness not present.        Left: Mass or tenderness not present.     Epididymis:     Right: Not inflamed or enlarged. No mass or tenderness.     Left: Not inflamed. Mass (small cyst, not tender) present. No tenderness.     Prostate: Not enlarged, not tender and no nodules present.     Rectum: No mass or tenderness.  Musculoskeletal:     Lumbar back: Spasms and tenderness present. Negative right straight leg raise test and negative left straight leg raise test.     Right lower leg: No edema.     Left lower leg: No edema.  Lymphadenopathy:     Cervical: No cervical adenopathy.     Lower Body: No right inguinal adenopathy. No left inguinal adenopathy.  Skin:    General: Skin is warm.     Findings: No erythema or rash.  Neurological:     Mental Status: Jermaine Hill is alert  and oriented to person, place, and time.     Cranial Nerves: No cranial nerve deficit.     Gait: Gait normal.  Psychiatric:        Mood and Affect: Affect normal. Mood is anxious.    ASSESSMENT AND PLAN:  Mr. Jermaine Hill was seen today for groin pain.  Lab Results  Component Value Date   NA 137 10/04/2023   CL 103 10/04/2023   K 3.9 10/04/2023   CO2 25 10/04/2023   BUN 17 10/04/2023  CREATININE 0.80 10/04/2023   GFR 105.30 10/04/2023   CALCIUM  9.1 10/04/2023   ALBUMIN 4.1 03/22/2020   GLUCOSE 95 10/04/2023   Lab Results  Component Value Date   HGBA1C 5.8 10/04/2023   Lab Results  Component Value Date   WBC 7.2 10/04/2023   HGB 15.0 10/04/2023   HCT 44.6 10/04/2023   MCV 92.3 10/04/2023   PLT 336.0 10/04/2023   Lab Results  Component Value Date   PSA 0.83 10/04/2023   PSA 0.95 11/19/2020   Chronic bilateral low back pain, unspecified whether sciatica present Assessment & Plan: This problem has been going on for a few years. Jermaine Hill is no longer following with orthopedics and for now Jermaine Hill is not interested in doing so. I suggest consultation with chiropractor. Avoid activities that could aggravate pain. Jermaine Hill agrees with trying Celebrex 100 mg daily as needed for pain and duloxetine 30 mg daily. We discussed son side effects of medications. Follow-up in 6 weeks, before if needed.  Orders: -     DULoxetine HCl; Take 1 capsule (30 mg total) by mouth daily.  Dispense: 30 capsule; Refill: 1 -     Celecoxib; Take 1 capsule (100 mg total) by mouth daily as needed.  Dispense: 30 capsule; Refill: 1  Pain in both testicles History and examination today do not suggest a serious process. A small epididymal cyst left side. For now I do not think imaging is needed. Monitor for new symptoms. Referred pain is to be considered, symptoms may improve with duloxetine, which was started today to treat chronic back pain. Instructed about warning signs.  -     Basic metabolic panel with GFR;  Future -     CBC; Future  Prediabetes Assessment & Plan: Encouraged consistency with a healthy lifestyle. Last hemoglobin A1c 5.8 in 11/2020. Further recommendation will be given according to hemoglobin A1c result.  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  Recommend establishing with eye care provider to follow on this problem. For allergy conjunctivitis symptoms recommend cromolyn eyedrops 3-4 times per day as needed.  -     Cromolyn Sodium; Place 1-2 drops into both eyes 4 (four) times daily as needed.  Dispense: 10 mL; Refill: 1  Urinary frequency Assessment & Plan: This is not a new problem. We discussed possible etiologies. Jermaine Hill prefers to hold on trial of Flomax. Recommend decreasing juices and sodas intake and avoid fluids 3 to 4 hours before bedtime. Monitor for new symptoms. Further recommendation will be given according to lab results.  Orders: -     Urinalysis w microscopic + reflex cultur -     PSA; Future  Colon cancer screening -     Cologuard  Encounter for HCV screening test for low risk patient -     Hepatitis C antibody; Future  I spent a total of 44 minutes in both face to face and non face to face activities for this visit on the date of this encounter. During this time history was obtained and documented, examination was performed, prior labs/imaging reviewed, and assessment/plan discussed.  Return in 6 weeks (on 11/15/2023), or if symptoms worsen or fail to improve.  I, Fritz Jewel Wierda, acting as a scribe for Ivannah Zody [place], MD., have documented all relevant documentation on the behalf of Jermaine Sisk [place], MD, as directed by  Camilla Skeen [place], MD while in the presence  of Bonne Whack [place], MD.   I, Bryonna Sundby [place], MD, have reviewed all documentation for this visit. The documentation on 10/04/23 for the exam, diagnosis, procedures, and orders are all accurate and  complete.  Labrandon Knoch G. [place], MD  [facility]. [place] office.

## 2023-10-04 NOTE — Assessment & Plan Note (Signed)
 This problem has been going on for a few years. He is no longer following with orthopedics and for now he is not interested in doing so. I suggest consultation with chiropractor. Avoid activities that could aggravate pain. He agrees with trying Celebrex 100 mg daily as needed for pain and duloxetine 30 mg daily. We discussed son side effects of medications. Follow-up in 6 weeks, before if needed.

## 2023-10-04 NOTE — Assessment & Plan Note (Signed)
 Encouraged consistency with a healthy lifestyle. Last hemoglobin A1c 5.8 in 11/2020. Further recommendation will be given according to hemoglobin A1c result.

## 2023-10-05 LAB — CBC
HCT: 44.6 % (ref 39.0–52.0)
Hemoglobin: 15 g/dL (ref 13.0–17.0)
MCHC: 33.7 g/dL (ref 30.0–36.0)
MCV: 92.3 fl (ref 78.0–100.0)
Platelets: 336 10*3/uL (ref 150.0–400.0)
RBC: 4.83 Mil/uL (ref 4.22–5.81)
RDW: 13.3 % (ref 11.5–15.5)
WBC: 7.2 10*3/uL (ref 4.0–10.5)

## 2023-10-05 LAB — URINALYSIS W MICROSCOPIC + REFLEX CULTURE
Bacteria, UA: NONE SEEN /HPF
Bilirubin Urine: NEGATIVE
Glucose, UA: NEGATIVE
Hgb urine dipstick: NEGATIVE
Hyaline Cast: NONE SEEN /LPF
Ketones, ur: NEGATIVE
Leukocyte Esterase: NEGATIVE
Nitrites, Initial: NEGATIVE
Protein, ur: NEGATIVE
RBC / HPF: NONE SEEN /HPF (ref 0–2)
Specific Gravity, Urine: 1.03 (ref 1.001–1.035)
Squamous Epithelial / HPF: NONE SEEN /HPF (ref <=5)
WBC, UA: NONE SEEN /HPF (ref 0–5)
pH: 6 (ref 5.0–8.0)

## 2023-10-05 LAB — HEPATITIS C ANTIBODY: Hepatitis C Ab: NONREACTIVE

## 2023-10-05 LAB — BASIC METABOLIC PANEL WITH GFR
BUN: 17 mg/dL (ref 6–23)
CO2: 25 meq/L (ref 19–32)
Calcium: 9.1 mg/dL (ref 8.4–10.5)
Chloride: 103 meq/L (ref 96–112)
Creatinine, Ser: 0.8 mg/dL (ref 0.40–1.50)
GFR: 105.3 mL/min (ref >60.00)
Glucose, Bld: 95 mg/dL (ref 70–99)
Potassium: 3.9 meq/L (ref 3.5–5.1)
Sodium: 137 meq/L (ref 135–145)

## 2023-10-05 LAB — NO CULTURE INDICATED

## 2023-10-05 LAB — PSA: PSA: 0.83 ng/mL (ref 0.10–4.00)

## 2023-10-05 LAB — HEMOGLOBIN A1C: Hgb A1c MFr Bld: 5.8 % (ref 4.6–6.5)

## 2023-10-13 DIAGNOSIS — Z1211 Encounter for screening for malignant neoplasm of colon: Secondary | ICD-10-CM | POA: Diagnosis not present

## 2023-10-21 LAB — COLOGUARD: COLOGUARD: NEGATIVE

## 2023-10-22 ENCOUNTER — Ambulatory Visit: Payer: Self-pay | Admitting: Family Medicine

## 2023-11-14 ENCOUNTER — Ambulatory Visit: Admitting: Family Medicine
